# Patient Record
Sex: Female | Born: 1965 | Race: White | Hispanic: No | Marital: Married | Smoking: Never smoker
Health system: Southern US, Community
[De-identification: ages and names within clinical notes are randomized; demographics above are authoritative.]

## PROBLEM LIST (undated history)

## (undated) DIAGNOSIS — Z923 Personal history of irradiation: Secondary | ICD-10-CM

## (undated) DIAGNOSIS — K219 Gastro-esophageal reflux disease without esophagitis: Secondary | ICD-10-CM

## (undated) DIAGNOSIS — K5909 Other constipation: Secondary | ICD-10-CM

## (undated) DIAGNOSIS — Z8042 Family history of malignant neoplasm of prostate: Secondary | ICD-10-CM

## (undated) DIAGNOSIS — E7800 Pure hypercholesterolemia, unspecified: Secondary | ICD-10-CM

## (undated) DIAGNOSIS — M722 Plantar fascial fibromatosis: Secondary | ICD-10-CM

## (undated) DIAGNOSIS — Z800 Family history of malignant neoplasm of digestive organs: Secondary | ICD-10-CM

## (undated) DIAGNOSIS — I10 Essential (primary) hypertension: Secondary | ICD-10-CM

## (undated) DIAGNOSIS — C50919 Malignant neoplasm of unspecified site of unspecified female breast: Secondary | ICD-10-CM

## (undated) DIAGNOSIS — C801 Malignant (primary) neoplasm, unspecified: Secondary | ICD-10-CM

## (undated) DIAGNOSIS — E538 Deficiency of other specified B group vitamins: Secondary | ICD-10-CM

## (undated) HISTORY — PX: TONSILLECTOMY: SUR1361

## (undated) HISTORY — PX: BREAST LUMPECTOMY: SHX2

## (undated) HISTORY — PX: SKIN BIOPSY: SHX1

## (undated) HISTORY — DX: Family history of malignant neoplasm of prostate: Z80.42

## (undated) HISTORY — DX: Other constipation: K59.09

## (undated) HISTORY — DX: Family history of malignant neoplasm of digestive organs: Z80.0

## (undated) HISTORY — PX: CHOLECYSTECTOMY: SHX55

## (undated) HISTORY — DX: Malignant (primary) neoplasm, unspecified: C80.1

## (undated) HISTORY — PX: APPENDECTOMY: SHX54

## (undated) HISTORY — DX: Pure hypercholesterolemia, unspecified: E78.00

## (undated) HISTORY — DX: Malignant neoplasm of unspecified site of unspecified female breast: C50.919

## (undated) HISTORY — DX: Deficiency of other specified B group vitamins: E53.8

## (undated) HISTORY — DX: Plantar fascial fibromatosis: M72.2

## (undated) HISTORY — PX: OOPHORECTOMY: SHX86

## (undated) HISTORY — DX: Essential (primary) hypertension: I10

## (undated) HISTORY — PX: OTHER SURGICAL HISTORY: SHX169

## (undated) HISTORY — DX: Gastro-esophageal reflux disease without esophagitis: K21.9

---

## 1997-07-27 ENCOUNTER — Other Ambulatory Visit: Admission: RE | Admit: 1997-07-27 | Discharge: 1997-07-27 | Payer: Self-pay | Admitting: Obstetrics and Gynecology

## 1998-07-30 ENCOUNTER — Other Ambulatory Visit: Admission: RE | Admit: 1998-07-30 | Discharge: 1998-07-30 | Payer: Self-pay | Admitting: Obstetrics and Gynecology

## 2002-09-16 DIAGNOSIS — C8590 Non-Hodgkin lymphoma, unspecified, unspecified site: Secondary | ICD-10-CM | POA: Insufficient documentation

## 2004-03-21 ENCOUNTER — Ambulatory Visit: Payer: Self-pay | Admitting: Oncology

## 2004-05-10 ENCOUNTER — Encounter (INDEPENDENT_AMBULATORY_CARE_PROVIDER_SITE_OTHER): Payer: Self-pay | Admitting: Oncology

## 2004-05-10 ENCOUNTER — Ambulatory Visit: Payer: Self-pay | Admitting: Oncology

## 2004-05-10 ENCOUNTER — Other Ambulatory Visit: Admission: RE | Admit: 2004-05-10 | Discharge: 2004-05-10 | Payer: Self-pay | Admitting: Oncology

## 2004-07-15 ENCOUNTER — Ambulatory Visit: Payer: Self-pay | Admitting: Oncology

## 2004-09-28 ENCOUNTER — Ambulatory Visit: Payer: Self-pay | Admitting: Oncology

## 2005-02-06 ENCOUNTER — Ambulatory Visit: Payer: Self-pay | Admitting: Oncology

## 2005-05-01 ENCOUNTER — Ambulatory Visit: Payer: Self-pay | Admitting: Oncology

## 2005-07-26 ENCOUNTER — Ambulatory Visit: Payer: Self-pay | Admitting: Oncology

## 2005-10-18 ENCOUNTER — Ambulatory Visit: Payer: Self-pay | Admitting: Oncology

## 2006-01-10 ENCOUNTER — Ambulatory Visit: Payer: Self-pay | Admitting: Oncology

## 2006-04-04 ENCOUNTER — Ambulatory Visit: Payer: Self-pay | Admitting: Oncology

## 2006-06-27 ENCOUNTER — Ambulatory Visit: Payer: Self-pay | Admitting: Oncology

## 2006-09-19 ENCOUNTER — Ambulatory Visit: Payer: Self-pay | Admitting: Oncology

## 2007-11-26 DIAGNOSIS — D519 Vitamin B12 deficiency anemia, unspecified: Secondary | ICD-10-CM | POA: Insufficient documentation

## 2012-06-24 HISTORY — PX: ESOPHAGOGASTRODUODENOSCOPY: SHX1529

## 2013-02-27 HISTORY — PX: BREAST LUMPECTOMY: SHX2

## 2013-08-08 ENCOUNTER — Encounter: Payer: Self-pay | Admitting: Gastroenterology

## 2013-08-08 HISTORY — PX: COLONOSCOPY: SHX174

## 2013-10-16 DIAGNOSIS — C50919 Malignant neoplasm of unspecified site of unspecified female breast: Secondary | ICD-10-CM

## 2013-10-16 HISTORY — DX: Malignant neoplasm of unspecified site of unspecified female breast: C50.919

## 2014-03-17 ENCOUNTER — Telehealth: Payer: Self-pay | Admitting: Genetic Counselor

## 2014-03-17 NOTE — Telephone Encounter (Signed)
S/W PATIENT AND GAVE GENETIC APPT FOR 01/20 @ 3 Shari Prows POWELL

## 2014-03-18 ENCOUNTER — Other Ambulatory Visit: Payer: Self-pay

## 2014-03-18 ENCOUNTER — Ambulatory Visit (HOSPITAL_BASED_OUTPATIENT_CLINIC_OR_DEPARTMENT_OTHER): Payer: BC Managed Care – PPO | Admitting: Genetic Counselor

## 2014-03-18 ENCOUNTER — Encounter: Payer: Self-pay | Admitting: Genetic Counselor

## 2014-03-18 DIAGNOSIS — C801 Malignant (primary) neoplasm, unspecified: Secondary | ICD-10-CM | POA: Insufficient documentation

## 2014-03-18 DIAGNOSIS — Z800 Family history of malignant neoplasm of digestive organs: Secondary | ICD-10-CM

## 2014-03-18 DIAGNOSIS — Z859 Personal history of malignant neoplasm, unspecified: Secondary | ICD-10-CM

## 2014-03-18 DIAGNOSIS — Z8572 Personal history of non-Hodgkin lymphomas: Secondary | ICD-10-CM

## 2014-03-18 DIAGNOSIS — Z853 Personal history of malignant neoplasm of breast: Secondary | ICD-10-CM

## 2014-03-18 DIAGNOSIS — Z8042 Family history of malignant neoplasm of prostate: Secondary | ICD-10-CM | POA: Insufficient documentation

## 2014-03-18 NOTE — Progress Notes (Signed)
REFERRING PROVIDER: Hosie Poisson, MD  PRIMARY PROVIDER:  No primary care provider on file.  PRIMARY REASON FOR VISIT:  1. Personal history of lymphoma   2. Personal history of breast cancer   3. Family history of colon cancer      HISTORY OF PRESENT ILLNESS:   Ms. Homewood, a 49 y.o. female, was seen for a [place] cancer genetics consultation at the request of Dr. Hinton Rao due to a personal and family history of cancer.  Ms. Centner presents to clinic today to discuss the possibility of a hereditary predisposition to cancer, genetic testing, and to further clarify her future cancer risks, as well as potential cancer risks for family members.   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  This was excised but no other treatment occurred.  In 2006, at the age of 39, Ms. Marston was diagnosed with lymphoma again and received chemotherapy.  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  This was treated with lumpectomy and radiation.  She is currently on tamoxifen.      CANCER HISTORY:   No history exists.     HORMONAL RISK FACTORS:  Menarche was at age 68.  First live birth at age 10.  OCP use for approximately 27 years.  Ovaries intact: yes.  Hysterectomy: no.  Menopausal status: premenopausal.  HRT use: 0 years. Colonoscopy: yes; 1 polyp. Mammogram within the last year: yes. Number of breast biopsies: 1. Up to date with pelvic exams:  yes. Any excessive radiation exposure in the past:  yes  Past Medical History  Diagnosis Date  . Breast cancer 10/16/2013  . Cancer 2004/2006    Low B Cell lymphoma  . Family history of colon cancer   . Family history of prostate cancer     History reviewed. No pertinent past surgical history.  History   Social History  . Marital Status: Married    Spouse Name: N/A    Number of Children: 1  . Years of Education: N/A   Social History  Main Topics  . Smoking status: Never Smoker   . Smokeless tobacco: None  . Alcohol Use: No  . Drug Use: None  . Sexual Activity: None   Other Topics Concern  . None   Social History Narrative  . None     FAMILY HISTORY:  We obtained a detailed, 4-generation family history.  Significant diagnoses are listed below: Family History  Problem Relation Age of Onset  . Lung cancer Father 34  . Cancer Maternal Aunt 88    GIST  . Colon cancer Maternal Uncle 61    dx twice at age 13  . Lung cancer Maternal Grandmother 28  . Prostate cancer Maternal Grandfather 58   Ms. Couser has a 34 YO brother.  Her parents are both deceased.  She has two maternal aunts and a maternal uncle.  Her maternal uncle was diagnosed with colon cancer, twice, at age 63.  One aunt had a GIST at age 66 and died at 29.  Her maternal grandfather had prostate cancer and died at 31.  His maternal aunts, uncles and cousins reportedly had colon cancer.  Patient's ancestors are of Caucasian descent. There is no reported Ashkenazi Jewish ancestry. There is no known consanguinity.  GENETIC COUNSELING ASSESSMENT: LIALA CODISPOTI is a 49 y.o. female with a personal history of breast cancer  and lymphoma and a family history of colon and prostate cancer and GIST. We, therefore, discussed and recommended the following at today's visit.   DISCUSSION: We reviewed the characteristics, features and inheritance patterns of hereditary cancer syndromes. We discussed that both she and her uncle were diagnosed with cancer more than one time, which is a sign of a hereditary cancer syndorme.  However, I am not able to link one type of cancer syndrome to this family.  My biggest concern is the GIST, and there is a remote family history of colon cancer. We also discussed genetic testing, including the appropriate family members to test, the process of testing, insurance coverage and turn-around-time for results. We discussed that we can do a  preverification for a CancerNext-expanded panel in order to get the colon, breast and GIST cancer genes.  We also discussed trying to get her uncle, Wilburn Mylar, in for testing.  Since he is closer to the relatives with colon cancer, he may qualify for Lynch syndrome testing.  PLAN: After considering the risks, benefits, and limitations,Ms. Kenagy  provided informed consent to pursue preverification for genetic testing through [facility] for the CancerNext-Expanded panel test.  The CancerNext-Expanded gene panel offered by [employer] and includes sequencing and rearrangement analysis for the following 49 genes: APC, ATM, BAP1, BARD1, BMPR1A, BRCA1, BRCA2, BRIP1, CDH1, CDK4, CDKN2A, CHEK2, FH, FLCN, MAX, MEN1, MET, MLH1, MRE11A, MSH2, MSH6, MUTYH, NBN, NF1, PALB2, PMS2, POLD1, POLE, PTEN, RAD50, RAD51D, RET, SDHA, SDHAF2, SDHB, SDHC, SDHD, SMAD4, SMARCA4, STK11, TMEM127, TP53, TSC1, TSC2, and VHL. If she has a low out of pocket cost, Ms. Langsam will come back and get her blood drawn.  Results should be available within approximately 3-4 weeks' time, at which point they will be disclosed by telephone to Ms. Gilkes, as will any additional recommendations warranted by these results. Ms. Salas will receive a summary of her genetic counseling visit and a copy of her results once available. This information will also be available in Epic. We encouraged Ms. Vizzini to remain in contact with cancer genetics annually so that we can continuously update the family history and inform her of any changes in cancer genetics and testing that may be of benefit for her family. Ms. Portillo questions were answered to her satisfaction today. Our contact information was provided should additional questions or concerns arise.  Based on Ms. Schoneman's family history, we recommended her uncle, Wilburn Mylar, who was diagnosed with colon cancer at age 2, have genetic counseling and testing. Ms. Lein will let us know if we can be  of any assistance in coordinating genetic counseling and/or testing for this family member.   Lastly, we encouraged Ms. Kubitz to remain in contact with cancer genetics annually so that we can continuously update the family history and inform her of any changes in cancer genetics and testing that may be of benefit for this family.   Ms.  Tatsch questions were answered to her satisfaction today. Our contact information was provided should additional questions or concerns arise. Thank you for the referral and allowing [place] to share in the care of your patient.   Karen P. Florene Glen, [place], [facility] Certified Genetic Counselor [email or web address] phone: [phone]  The patient was seen for a total of 60 minutes in face-to-face genetic counseling.  This patient was discussed with Drs. Magrinat, Lindi Adie and/or Burr Medico who agrees with the above.    _______________________________________________________________________ For Office Staff:  Number of people involved in session: 2 Was an Intern/  student involved with case: no

## 2014-04-13 ENCOUNTER — Other Ambulatory Visit: Payer: BC Managed Care – PPO

## 2014-04-13 ENCOUNTER — Encounter: Payer: BC Managed Care – PPO | Admitting: Genetic Counselor

## 2014-04-13 ENCOUNTER — Telehealth: Payer: Self-pay | Admitting: Genetic Counselor

## 2014-04-13 NOTE — Telephone Encounter (Signed)
Pt aware of appt.

## 2014-04-27 ENCOUNTER — Other Ambulatory Visit: Payer: BC Managed Care – PPO

## 2014-04-27 ENCOUNTER — Ambulatory Visit (HOSPITAL_BASED_OUTPATIENT_CLINIC_OR_DEPARTMENT_OTHER): Payer: BC Managed Care – PPO | Admitting: Genetic Counselor

## 2014-04-27 ENCOUNTER — Encounter: Payer: Self-pay | Admitting: Genetic Counselor

## 2014-04-27 DIAGNOSIS — Z8572 Personal history of non-Hodgkin lymphomas: Secondary | ICD-10-CM

## 2014-04-27 DIAGNOSIS — Z853 Personal history of malignant neoplasm of breast: Secondary | ICD-10-CM

## 2014-04-27 DIAGNOSIS — Z800 Family history of malignant neoplasm of digestive organs: Secondary | ICD-10-CM

## 2014-04-27 DIAGNOSIS — Z859 Personal history of malignant neoplasm, unspecified: Secondary | ICD-10-CM

## 2014-04-27 NOTE — Progress Notes (Signed)
Kerri Jones came in to complete paperwork and have a blood draw performed.  Blood was drawn for the breast/GYN, Colon cancer and GIST panels through [facility].  [facility] will preauthorize [name]'s test and if she is found to have an OOP cost over $100 will call for approval.  Results will be back in 3-4 weeks.

## 2014-05-07 ENCOUNTER — Encounter: Payer: Self-pay | Admitting: Genetic Counselor

## 2014-05-07 ENCOUNTER — Telehealth: Payer: Self-pay | Admitting: Genetic Counselor

## 2014-05-07 DIAGNOSIS — Z1379 Encounter for other screening for genetic and chromosomal anomalies: Secondary | ICD-10-CM | POA: Insufficient documentation

## 2014-05-07 NOTE — Telephone Encounter (Signed)
Revealed negative genetic testing on a custom panel created through [facility] of 37 genes testing for hereditary breast, colon and GIST tumors.

## 2014-05-07 NOTE — Progress Notes (Signed)
HPI: Kerri Jones was previously seen in the [place] clinic due to a personal history of lymphoma and breast cancer and family history of breast, colon and GIST cancer and concerns regarding a hereditary predisposition to cancer. Please refer to our prior cancer genetics clinic note for more information regarding Kerri Jones's medical, social and family histories, and our assessment and recommendations, at the time. Kerri Jones recent genetic test results were disclosed to her, as were recommendations warranted by these results. These results and recommendations are discussed in more detail below.  GENETIC TEST RESULTS: At the time of Kerri Jones's visit, we recommended she pursue genetic testing of the custom gene panel. The custom panel for breast, colon and paraganglioma cancer syndrome gene panel offered by [facility] and includes sequencing and rearrangement analysis for the following 37 genes: APC, ATM, AXIN2, BARD1, BMPR1A, BRCA1, BRCA2, BRIP1, CDH1, CHEK2, DICER1, EPCAM (Deletion/duplication testing only), GREM1 (Promoter region deletion/duplication testing only), KIT, MLH1, MSH2, MSH6, MUTYH, NBN, NF1, PALB2, PDGFRA, PMS2, POLD1, POLE, PTEN, RAD50, RAD51C, RAD51D, SDHA (sequencing only), SDHB, SDHC, SDHD, SMAD4, SMARCA4, STK11 and TP53. The report date is 05/07/2014. The report date is May 07, 2014.  Testing was performed at [facility]. Genetic testing was normal, and did not reveal a deleterious mutation in these genes. The test report has been scanned into EPIC and is located under the Media tab.   We discussed with Kerri Jones that since the current genetic testing is not perfect, it is possible there may be a gene mutation in one of these genes that current testing cannot detect, but that chance is small. We also discussed, that it is possible that another gene that has not yet been discovered, or that we have not yet tested, is responsible for the cancer  diagnoses in the family, and it is, therefore, important to remain in touch with cancer genetics in the future so that we can continue to offer Kerri Jones the most up to date genetic testing.   CANCER SCREENING RECOMMENDATIONS: This result is reassuring and suggests that Kerri Jones's personal and family history of cancer was most likely not due to an inherited predisposition associated with one of these genes. Most cancers happen by chance and this negative test, along with details of her family history, suggests that her cancer falls into this category. We, therefore, recommended she continue to follow the cancer management and screening guidelines provided by her oncology and primary providers.   RECOMMENDATIONS FOR FAMILY MEMBERS: Women in this family might be at some increased risk of developing cancer, over the general population risk, simply due to the family history of cancer. We recommended women in this family have a yearly mammogram beginning at age 68, or 47 years younger than the earliest onset of cancer, an an annual clinical breast exam, and perform monthly breast self-exams. Women in this family should also have a gynecological exam as recommended by their primary provider. All family members should have a colonoscopy by age 59.  FOLLOW-UP: Lastly, we discussed with Kerri Jones that cancer genetics is a rapidly advancing field and it is possible that new genetic tests will be appropriate for her and/or her family members in the future. We encouraged her to remain in contact with cancer genetics on an annual basis so we can update her personal and family histories and let her know of advances in cancer genetics that may benefit this family.   Our contact number was provided. Kerri Jones questions  were answered to her satisfaction, and she knows she is welcome to call us at anytime with additional questions or concerns.   Roma Kayser, MS, [facility] Certified Genetic  Counselor [email or web address]

## 2014-06-01 ENCOUNTER — Encounter: Payer: Self-pay | Admitting: Genetic Counselor

## 2014-08-11 ENCOUNTER — Other Ambulatory Visit: Payer: Self-pay

## 2014-08-11 DIAGNOSIS — C8599 Non-Hodgkin lymphoma, unspecified, extranodal and solid organ sites: Secondary | ICD-10-CM | POA: Insufficient documentation

## 2014-08-12 ENCOUNTER — Other Ambulatory Visit (HOSPITAL_COMMUNITY)
Admission: RE | Admit: 2014-08-12 | Discharge: 2014-08-12 | Disposition: A | Discharge status: Home / Self Care | Payer: BC Managed Care – PPO | Source: Ambulatory Visit | Attending: Obstetrics and Gynecology | Admitting: Obstetrics and Gynecology

## 2014-08-12 DIAGNOSIS — C50919 Malignant neoplasm of unspecified site of unspecified female breast: Secondary | ICD-10-CM | POA: Diagnosis not present

## 2014-08-25 ENCOUNTER — Encounter (HOSPITAL_COMMUNITY): Payer: Self-pay

## 2014-08-26 ENCOUNTER — Telehealth: Payer: Self-pay | Admitting: *Deleted

## 2014-08-26 NOTE — Telephone Encounter (Signed)
Received call from Bebe Liter at Dr. Gordy Councilman office requesting to cancel appt for patient on 09/04/14 with Dr. Denman George. She states Dr. Hinton Rao, the patient's oncologist, does not feel that she needs to see Dr. Denman George at this time. Appt cancelled and Butch Penny instructed to please call our office back in the future if patient does need to be seen.

## 2014-09-04 ENCOUNTER — Ambulatory Visit: Payer: BC Managed Care – PPO | Admitting: Gynecologic Oncology

## 2015-11-04 DIAGNOSIS — N63 Unspecified lump in breast: Secondary | ICD-10-CM | POA: Diagnosis not present

## 2015-11-04 DIAGNOSIS — Z853 Personal history of malignant neoplasm of breast: Secondary | ICD-10-CM | POA: Diagnosis not present

## 2015-11-04 DIAGNOSIS — Z8572 Personal history of non-Hodgkin lymphomas: Secondary | ICD-10-CM | POA: Diagnosis not present

## 2016-03-08 DIAGNOSIS — Z853 Personal history of malignant neoplasm of breast: Secondary | ICD-10-CM

## 2016-03-08 DIAGNOSIS — Z8572 Personal history of non-Hodgkin lymphomas: Secondary | ICD-10-CM | POA: Diagnosis not present

## 2016-04-13 ENCOUNTER — Ambulatory Visit (INDEPENDENT_AMBULATORY_CARE_PROVIDER_SITE_OTHER): Payer: BC Managed Care – PPO | Admitting: Pulmonary Disease

## 2016-04-13 ENCOUNTER — Encounter: Payer: Self-pay | Admitting: Pulmonary Disease

## 2016-04-13 DIAGNOSIS — G4710 Hypersomnia, unspecified: Secondary | ICD-10-CM

## 2016-04-13 NOTE — Assessment & Plan Note (Signed)
Given excessive daytime somnolence, narrow pharyngeal exam, witnessed apneas & loud snoring, obstructive sleep apnea is very likely & an overnight polysomnogram will be scheduled as a split study. The pathophysiology of obstructive sleep apnea , it's cardiovascular consequences & modes of treatment including CPAP were discused with the patient in detail & they evidenced understanding.  

## 2016-04-13 NOTE — Patient Instructions (Signed)
We discussed the possibility of obstructive sleep apnea Schedule sleep study

## 2016-04-13 NOTE — Progress Notes (Signed)
Subjective:    Patient ID: Kerri Jones, female    DOB: [birth year], 51 y.o.   MRN: [id number]  HPI  Chief Complaint  Patient presents with  . Sleep Consult    Snoring. Pt. states she wakes from snoring at night. Does not feel rested in the morning.     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 Loud snoring has been noted by her husband and this occasionally wakes her up gasping from her sleep. She's noted over the past 2 years that she easily falls asleep and radius social situations. Epworth sleepiness score is 20 and she reports sleepiness while watching TV, sitting inactive in a public place or as a passenger in a car. She was evaluated by ENT in [place]. Bedtime is between 10 and 10:30 PM, sleep latency is 15-30 minutes, she sleeps on her right side with one pillow. She has used a mouth guard for at least 2 years due to bruxism. She reports frequent nocturnal awakenings and non-refreshing sleep with occasional bathroom visits, is out of bed by 5:45 AM feeling tired with occasional dryness of mouth but denies headaches. 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 In 2015 she was diagnosed with breast cancer, could not tolerate tamoxifen and underwent oophorectomy and radiation    Past Medical History:  Diagnosis Date  . Breast cancer ([place]) 10/16/2013  . Cancer ([place]) 2004/2006   Low B Cell lymphoma  . Family history of colon cancer   . Family history of prostate cancer     No past surgical history on file.   Allergies  Allergen Reactions  . Rituxan [Rituximab]   . Vicodin [Hydrocodone-Acetaminophen]      Social History   Social History  . Marital status:  Married    Spouse name: N/A  . Number of children: 1  . Years of education: N/A   Occupational History  . Not on file.   Social History Main Topics  . Smoking status: Never Smoker  . Smokeless tobacco: Never Used  . Alcohol use No  . Drug use: Unknown  . Sexual activity: Not on file   Other Topics Concern  . Not on file   Social History Narrative  . No narrative on file    Family History  Problem Relation Age of Onset  . Lung cancer Father 35  . Cancer Maternal Aunt 60    GIST  . Colon cancer Maternal Uncle 67    dx twice at age 56  . Lung cancer Maternal Grandmother 41  . Prostate cancer Maternal Grandfather 64    Review of Systems Constitutional: negative for anorexia, fevers and sweats  Eyes: negative for irritation, redness and visual disturbance  Ears, nose, mouth, throat, and face: negative for earaches, epistaxis, nasal congestion and sore throat  Respiratory: negative for cough, dyspnea on exertion, sputum and wheezing  Cardiovascular: negative for chest pain, dyspnea, lower extremity edema, orthopnea, palpitations and syncope  Gastrointestinal: negative for abdominal pain, constipation, diarrhea, melena, nausea and vomiting  Genitourinary:negative for dysuria, frequency and hematuria  Hematologic/lymphatic: negative for bleeding, easy bruising and lymphadenopathy  Musculoskeletal:negative for arthralgias, muscle weakness and stiff joints  Neurological: negative for coordination problems, gait problems, headaches  and weakness  Endocrine: negative for diabetic symptoms including polydipsia, polyuria and weight loss     Objective:   Physical Exam  Gen. Pleasant,  in no distress ENT - no JVD, no post nasal drip Neck: No JVD, no thyromegaly, no carotid bruits Lungs: no use of accessory muscles, no dullness to percussion, decreased without rales or rhonchi  Cardiovascular: Rhythm regular, heart sounds  normal, no murmurs or gallops, no peripheral  edema Musculoskeletal: No deformities, no cyanosis or clubbing , no tremors       Assessment & Plan:

## 2016-05-08 ENCOUNTER — Telehealth: Payer: Self-pay | Admitting: Pulmonary Disease

## 2016-05-08 NOTE — Telephone Encounter (Signed)
Yes pt states it is ok to send the records

## 2016-05-08 NOTE — Telephone Encounter (Signed)
[facility] ENT is requesting last OV note to be faxed to them. Attempted to contact pt to make sure that she is actually a pt at this office. When she answered the phone, she asked that we call her back later.

## 2016-05-08 NOTE — Telephone Encounter (Signed)
OV note has been faxed. Nothing further was needed. 

## 2016-05-16 DIAGNOSIS — L989 Disorder of the skin and subcutaneous tissue, unspecified: Secondary | ICD-10-CM | POA: Diagnosis not present

## 2016-05-16 DIAGNOSIS — Z8572 Personal history of non-Hodgkin lymphomas: Secondary | ICD-10-CM | POA: Diagnosis not present

## 2016-05-31 ENCOUNTER — Ambulatory Visit (HOSPITAL_BASED_OUTPATIENT_CLINIC_OR_DEPARTMENT_OTHER): Payer: BC Managed Care – PPO | Attending: Pulmonary Disease | Admitting: Pulmonary Disease

## 2016-05-31 DIAGNOSIS — G4710 Hypersomnia, unspecified: Secondary | ICD-10-CM | POA: Diagnosis present

## 2016-06-01 ENCOUNTER — Encounter (HOSPITAL_BASED_OUTPATIENT_CLINIC_OR_DEPARTMENT_OTHER): Payer: BC Managed Care – PPO

## 2016-06-06 ENCOUNTER — Telehealth: Payer: Self-pay | Admitting: Pulmonary Disease

## 2016-06-06 DIAGNOSIS — G4710 Hypersomnia, unspecified: Secondary | ICD-10-CM

## 2016-06-06 NOTE — Procedures (Signed)
Patient Name: Kerri Jones, Kerri Jones Date: 05/31/2016 Gender: Female D.O.B: [birth year] Age (years): 71 Referring Provider: Kara Mead MD, ABSM Height (inches): 65 Interpreting Physician: Kara Mead MD, ABSM Weight (lbs): 180 RPSGT: Gerhard Perches BMI: 30 MRN: [id number] Neck Size: 13.00   CLINICAL INFORMATION Sleep Study Type: NPSG  Indication for sleep study: Snoring  Epworth Sleepiness Score: 16     SLEEP STUDY TECHNIQUE As per the AASM Manual for the Scoring of Sleep and Associated Events v2.3 (April 2016) with a hypopnea requiring 4% desaturations.  The channels recorded and monitored were frontal, central and occipital EEG, electrooculogram (EOG), submentalis EMG (chin), nasal and oral airflow, thoracic and abdominal wall motion, anterior tibialis EMG, snore microphone, electrocardiogram, and pulse oximetry.  SLEEP ARCHITECTURE The study was initiated at 10:15:27 PM and ended at 4:57:51 AM.  Sleep onset time was 39.3 minutes and the sleep efficiency was 47.6%. The total sleep time was 191.6 minutes.  Stage REM latency was 210.5 minutes.  The patient spent 37.85% of the night in stage N1 sleep, 35.79% in stage N2 sleep, 24.80% in stage N3 and 1.57% in REM.  Alpha intrusion was absent.  Supine sleep was 9.66%.  RESPIRATORY PARAMETERS The overall apnea/hypopnea index (AHI) was 0.0 per hour. There were 0 total apneas, including 0 obstructive, 0 central and 0 mixed apneas. There were 0 hypopneas and 6 RERAs.  The AHI during Stage REM sleep was 0.0 per hour.  AHI while supine was 0.0 per hour.  The mean oxygen saturation was 94.77%. The minimum SpO2 during sleep was 93.00%.  Moderate snoring was noted during this study.  CARDIAC DATA The 2 lead EKG demonstrated sinus rhythm. The mean heart rate was 58.06 beats per minute. Other EKG findings include: None.   LEG MOVEMENT DATA The total PLMS were 0 with a resulting PLMS index of 0.00. Associated arousal with  leg movement index was 0.0 .  IMPRESSIONS - No significant obstructive sleep apnea occurred during this study (AHI = 0.0/h). - No significant central sleep apnea occurred during this study (CAI = 0.0/h). - The patient had minimal or no oxygen desaturation during the study (Min O2 = 93.00%) - The patient snored with Moderate snoring volume. - No cardiac abnormalities were noted during this study. - Clinically significant periodic limb movements did not occur during sleep. No significant associated arousals.   DIAGNOSIS - No evidence of sleep disordered breathing    RECOMMENDATIONS - If sleepiness persists, review medications & consider MSLT Avoid alcohol, sedatives and other CNS depressants that may worsen sleep apnea and disrupt normal sleep architecture. - Sleep hygiene should be reviewed to assess factors that may improve sleep quality. - Weight management and regular exercise should be initiated or continued if appropriate.   Kara Mead MD Board Certified in [place]

## 2016-06-06 NOTE — Telephone Encounter (Signed)
No evidence of OSA on PSG If sleepiness persists, consider nap study to investigate for narcolepsy

## 2016-06-08 NOTE — Telephone Encounter (Signed)
Left message to call back for results

## 2016-06-09 NOTE — Telephone Encounter (Signed)
Pt returning call for sleep results, please advise [phone].Kerri Jones

## 2016-06-09 NOTE — Telephone Encounter (Signed)
Spoke with pt, aware of results/recs.  Wishes to proceed with nap study.  Nap study has been ordered.  Nothing further needed.

## 2016-07-03 ENCOUNTER — Telehealth: Payer: Self-pay | Admitting: Pulmonary Disease

## 2016-07-03 NOTE — Telephone Encounter (Signed)
OV note from 04/13/16 have been faxed to provided number. Received successful fax confirmation. Nothing further needed.

## 2016-07-18 ENCOUNTER — Encounter (HOSPITAL_BASED_OUTPATIENT_CLINIC_OR_DEPARTMENT_OTHER): Payer: BC Managed Care – PPO

## 2016-07-19 DIAGNOSIS — Z853 Personal history of malignant neoplasm of breast: Secondary | ICD-10-CM | POA: Diagnosis not present

## 2016-07-19 DIAGNOSIS — Z8572 Personal history of non-Hodgkin lymphomas: Secondary | ICD-10-CM | POA: Diagnosis not present

## 2016-08-23 ENCOUNTER — Ambulatory Visit (HOSPITAL_BASED_OUTPATIENT_CLINIC_OR_DEPARTMENT_OTHER): Payer: BC Managed Care – PPO | Attending: Pulmonary Disease | Admitting: Pulmonary Disease

## 2016-08-23 DIAGNOSIS — G4711 Idiopathic hypersomnia with long sleep time: Secondary | ICD-10-CM | POA: Diagnosis not present

## 2016-08-23 DIAGNOSIS — G4710 Hypersomnia, unspecified: Secondary | ICD-10-CM | POA: Diagnosis present

## 2016-08-28 DIAGNOSIS — G4711 Idiopathic hypersomnia with long sleep time: Secondary | ICD-10-CM | POA: Diagnosis not present

## 2016-08-28 NOTE — Procedures (Signed)
Patient Name: Kerri Jones, Kerri Jones Date: 08/23/2016 Gender: Female D.O.B: 1965-05-05 Age (years): 76 Referring Provider: Kara Mead MD, ABSM Height (inches): 65 Interpreting Physician: Kara Mead MD, ABSM Weight (lbs): 185 RPSGT: Jacolyn Reedy BMI: 31 MRN: [id number] Neck Size: 13.00   CLINICAL INFORMATION Sleep Study Type: MSLT  The patient was referred to the sleep center for evaluation of daytime sleepiness.  Epworth Sleepiness Score: 16   Most recent polysomnogram dated 05/31/2016 revealed an AHI of 0.0/h and RDI of 1.9/h.   SLEEP STUDY TECHNIQUE A Multiple Sleep Latency Test was performed after an overnight polysomnogram according to the AASM scoring manual v2.3 (April 2016) and clinical guidelines. Five nap opportunities occurred over the course of the test which followed an overnight polysomnogram. The channels recorded and monitored were frontal, central, and occipital electroencephalography (EEG), right and left electrooculogram (EOG), chin electromyography (EMG), and electrocardiogram (EKG).  MEDICATIONS Medications taken by the patient : N/A  Medications administered by patient during sleep study : No sleep medicine administered.   IMPRESSIONS - Total number of naps attempted: 5.00 . Total number of naps with sleep attained: 5 . The Mean Sleep Latency was 02:42 minutes. There were no sleep-onset REM periods. - The patient appears to have pathologic sleepiness, evidenced by a short mean sleep latency (8 minutes or less) on this MSLT. - No sleep onset REMs were noted during this MSLT.   DIAGNOSIS - Pathologic Sleepiness (- [G47.10 ICD-10]) - Idiopathic hypersomnia (327.11 [G47.11 ICD-10])   RECOMMENDATIONS - Return for follow up and management of Idiopathic Hypersomnia. - Return for follow up to evaluate other causes of excessive daytime sleepiness.   Kara Mead MD Board Certified in [place]

## 2016-09-11 ENCOUNTER — Telehealth: Payer: Self-pay | Admitting: Pulmonary Disease

## 2016-09-11 NOTE — Telephone Encounter (Signed)
Did not show narcolepsy. 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

## 2016-09-11 NOTE — Telephone Encounter (Signed)
Spoke with pt. She would like her results from the multiple latency test.  RA - please advise.

## 2016-09-12 NOTE — Telephone Encounter (Signed)
Spoke with pt. She is aware of her sleep latency test results. Pt has been scheduled to see TP on 10/12/16 at 4:15pm. Nothing further was needed.

## 2016-10-12 ENCOUNTER — Encounter: Payer: Self-pay | Admitting: Adult Health

## 2016-10-12 ENCOUNTER — Ambulatory Visit (INDEPENDENT_AMBULATORY_CARE_PROVIDER_SITE_OTHER): Payer: BC Managed Care – PPO | Admitting: Adult Health

## 2016-10-12 DIAGNOSIS — G4710 Hypersomnia, unspecified: Secondary | ICD-10-CM | POA: Diagnosis not present

## 2016-10-12 NOTE — Patient Instructions (Signed)
Healthy sleep regimen as discussed  Follow up As needed

## 2016-10-12 NOTE — Assessment & Plan Note (Signed)
Sleep study neg . MSLT neg c/w idiopathic hypersomina   Healthy sleep reigmen discussed and recommended.

## 2016-10-12 NOTE — Progress Notes (Signed)
@Patient  ID: Kerri Jones, female    DOB: November 04, 1965, 51 y.o.   MRN: [id number]  Chief Complaint  Patient presents with  . Follow-up    OSA     Referring provider: Marco Collie, MD  HPI: 51 yo female seen for sleep consult 03/2016 for daytime sleepiness   TEST  NSPG 05/2016 AHI 0  MSLT 07/2016 >neg , c/w idiopathic hypersomnia   10/12/2016 Follow up : Daytime sleepiness Patient returns for a six-month follow-up. She was seen in February for sleep consult. She was set up for a sleep study that was done in April that showed no significant sleep apnea with AHI of 0. She was set up for a sleep latency test for possible narcolepsy. This was negative. 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 We discussed a healthy sleep regimen , decreased caffeine intake , exercise and decreased computer/phone exposure.  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 Not as sleepy during daytime .    Allergies  Allergen Reactions  . Rituxan [Rituximab]   . Vicodin [Hydrocodone-Acetaminophen]     Immunization History  Administered Date(s) Administered  . Influenza [place] 11/28/2015    Past Medical History:  Diagnosis Date  . Breast cancer ([place]) 10/16/2013  . 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 Family history of colon cancer   . Family history of prostate cancer     Tobacco History: History  Smoking Status  . Never Smoker  Smokeless Tobacco  . Never Used   Counseling given: Not Answered   Outpatient Encounter Prescriptions as of 10/12/2016  Medication Sig  . amLODipine (NORVASC) 2.5 MG tablet Take 2.5 mg by mouth daily.  . cholecalciferol (VITAMIN D) 1000 units tablet Take 6,000 Units by mouth daily.  . citalopram (CELEXA) 10 MG tablet Take 10 mg by mouth daily.  Marland Kitchen dexlansoprazole (DEXILANT) 60 MG capsule Take 60 mg by mouth daily.  Marland Kitchen ezetimibe (ZETIA) 10 MG tablet Take 10 mg by mouth daily.  Marland Kitchen L-Lysine 1000 MG  TABS Take 1,000 mg by mouth daily.   No facility-administered encounter medications on file as of 10/12/2016.      Review of Systems  Constitutional:   No  weight loss, night sweats,  Fevers, chills, fatigue, or  lassitude.  HEENT:   No headaches,  Difficulty swallowing,  Tooth/dental problems, or  Sore throat,                No sneezing, itching, ear ache, nasal congestion, post nasal drip,   CV:  No chest pain,  Orthopnea, PND, swelling in lower extremities, anasarca, dizziness, palpitations, syncope.   GI  No heartburn, indigestion, abdominal pain, nausea, vomiting, diarrhea, change in bowel habits, loss of appetite, bloody stools.   Resp: No shortness of breath with exertion or at rest.  No excess mucus, no productive cough,  No non-productive cough,  No coughing up of blood.  No change in color of mucus.  No wheezing.  No chest wall deformity  Skin: no rash or lesions.  GU: no dysuria, change in color of urine, no urgency or frequency.  No flank pain, no hematuria   MS:  No joint pain or swelling.  No decreased range of motion.  No back pain.    Physical Exam  BP 112/80 (BP Location: Right Arm, Cuff Size: Normal)   Pulse 69   Ht 5' 7.5" (1.715 m)  Wt 187 lb 8 oz (85 kg)   SpO2 97%   BMI 28.93 kg/m   GEN: A/Ox3; pleasant , NAD, well nourished    HEENT:  [place]/AT,  EACs-clear, TMs-wnl, NOSE-clear, THROAT-clear, no lesions, no postnasal drip or exudate noted.   NECK:  Supple w/ fair ROM; no JVD; normal carotid impulses w/o bruits; no thyromegaly or nodules palpated; no lymphadenopathy.    RESP  Clear  P & A; w/o, wheezes/ rales/ or rhonchi. no accessory muscle use, no dullness to percussion  CARD:  RRR, no m/r/g, no peripheral edema, pulses intact, no cyanosis or clubbing.  GI:   Soft & nt; nml bowel sounds; no organomegaly or masses detected.   Musco: Warm bil, no deformities or joint swelling noted.   Neuro: alert, no focal deficits noted.    Skin: Warm, no  lesions or rashes    Lab Results:  CBC No results found for: WBC, RBC, HGB, HCT, PLT, MCV, MCH, MCHC, RDW, LYMPHSABS, MONOABS, EOSABS, BASOSABS  BMET No results found for: NA, K, CL, CO2, GLUCOSE, BUN, CREATININE, CALCIUM, GFRNONAA, GFRAA  BNP No results found for: BNP  ProBNP No results found for: PROBNP  Imaging: No results found.   Assessment & Plan:   Hypersomnolence Sleep study neg . MSLT neg c/w idiopathic hypersomina   Healthy sleep reigmen discussed and recommended.      Rexene Edison, NP 10/12/2016

## 2017-06-20 DIAGNOSIS — M549 Dorsalgia, unspecified: Secondary | ICD-10-CM | POA: Diagnosis not present

## 2017-06-20 DIAGNOSIS — Z8572 Personal history of non-Hodgkin lymphomas: Secondary | ICD-10-CM | POA: Diagnosis not present

## 2017-06-20 DIAGNOSIS — Z923 Personal history of irradiation: Secondary | ICD-10-CM | POA: Diagnosis not present

## 2017-06-20 DIAGNOSIS — Z853 Personal history of malignant neoplasm of breast: Secondary | ICD-10-CM | POA: Diagnosis not present

## 2017-06-20 DIAGNOSIS — R945 Abnormal results of liver function studies: Secondary | ICD-10-CM | POA: Diagnosis not present

## 2017-06-20 DIAGNOSIS — N6489 Other specified disorders of breast: Secondary | ICD-10-CM | POA: Diagnosis not present

## 2017-06-20 DIAGNOSIS — Z87440 Personal history of urinary (tract) infections: Secondary | ICD-10-CM | POA: Diagnosis not present

## 2017-06-20 DIAGNOSIS — Z9221 Personal history of antineoplastic chemotherapy: Secondary | ICD-10-CM | POA: Diagnosis not present

## 2017-06-20 DIAGNOSIS — Z800 Family history of malignant neoplasm of digestive organs: Secondary | ICD-10-CM | POA: Diagnosis not present

## 2017-08-03 DIAGNOSIS — N6489 Other specified disorders of breast: Secondary | ICD-10-CM | POA: Diagnosis not present

## 2017-08-03 DIAGNOSIS — Z9221 Personal history of antineoplastic chemotherapy: Secondary | ICD-10-CM | POA: Diagnosis not present

## 2017-08-03 DIAGNOSIS — Z853 Personal history of malignant neoplasm of breast: Secondary | ICD-10-CM | POA: Diagnosis not present

## 2017-08-03 DIAGNOSIS — Z8572 Personal history of non-Hodgkin lymphomas: Secondary | ICD-10-CM | POA: Diagnosis not present

## 2017-08-03 DIAGNOSIS — M549 Dorsalgia, unspecified: Secondary | ICD-10-CM | POA: Diagnosis not present

## 2017-08-03 DIAGNOSIS — Z923 Personal history of irradiation: Secondary | ICD-10-CM | POA: Diagnosis not present

## 2017-10-25 DIAGNOSIS — Z8572 Personal history of non-Hodgkin lymphomas: Secondary | ICD-10-CM | POA: Diagnosis not present

## 2017-10-25 DIAGNOSIS — Z923 Personal history of irradiation: Secondary | ICD-10-CM

## 2017-10-25 DIAGNOSIS — Z9221 Personal history of antineoplastic chemotherapy: Secondary | ICD-10-CM

## 2017-10-25 DIAGNOSIS — Z853 Personal history of malignant neoplasm of breast: Secondary | ICD-10-CM

## 2017-10-25 DIAGNOSIS — N6489 Other specified disorders of breast: Secondary | ICD-10-CM

## 2018-04-12 ENCOUNTER — Encounter: Payer: Self-pay | Admitting: Gastroenterology

## 2018-04-18 ENCOUNTER — Ambulatory Visit: Payer: BC Managed Care – PPO | Admitting: Gastroenterology

## 2018-04-26 ENCOUNTER — Encounter: Payer: Self-pay | Admitting: Gastroenterology

## 2018-05-03 ENCOUNTER — Encounter: Payer: Self-pay | Admitting: Gastroenterology

## 2018-05-03 ENCOUNTER — Ambulatory Visit: Payer: BC Managed Care – PPO | Admitting: Gastroenterology

## 2018-05-03 VITALS — BP 118/68 | HR 74 | Ht 64.0 in | Wt 193.0 lb

## 2018-05-03 DIAGNOSIS — Z86010 Personal history of colonic polyps: Secondary | ICD-10-CM

## 2018-05-03 DIAGNOSIS — K219 Gastro-esophageal reflux disease without esophagitis: Secondary | ICD-10-CM | POA: Diagnosis not present

## 2018-05-03 MED ORDER — DEXLANSOPRAZOLE 60 MG PO CPDR: 60.0000 mg | DELAYED_RELEASE_CAPSULE | Freq: Every day | ORAL | 11 refills | Status: DC

## 2018-05-03 NOTE — Patient Instructions (Addendum)
If you are age 54 or older, your body mass index should be between 23-30. Your Body mass index is 33.13 kg/m. If this is out of the aforementioned range listed, please consider follow up with your Primary Care Provider.  If you are age 79 or younger, your body mass index should be between 19-25. Your Body mass index is 33.13 kg/m. If this is out of the aformentioned range listed, please consider follow up with your Primary Care Provider.   We have sent the following medications to your pharmacy for you to pick up at your convenience: Dexilant    Please purchase the following medications over the counter and take as directed: Fusion Plus  It has been recommended to you by your physician that you have a(n) EGD/Colonoscopy completed. Per your request, we did not schedule the procedure(s) today. Please contact our office at [phone] should you decide to have the procedure completed.  Thank you,  Dr. Jackquline Denmark

## 2018-05-03 NOTE — Progress Notes (Signed)
Chief Complaint:   Referring Provider:  Marco Collie, MD      ASSESSMENT AND PLAN;   #1. H/O colonic polyps, FH of colonic polyps.  #2. GERD  #3. IDA (with Hb-12.3, MCV-80 with ferritin 28 on 05/02/2018).  Has H/O B12 deficiency.  #4.  Mildly abnormal LFTs -likely due to fatty liver (AST/ALT 48/40 March 2020). [place] 09/2014.  Plan: - Dexilant 60mg  po qd #90 - Get CT report from [facility].  This was done yesterday.  It appears that it was a CT chest. - Fusion plus 1 tab po qd. - Add B12 to the labs drawn yesterday if possible. - Proceed with EGD/colon. Discussed risks & benefits. (Risks including rare perforation req laparotomy, bleeding after biopsies/polypectomy req blood transfusion, rare chance of missing neoplasms, risks of anesthesia/sedation). Benefits outweigh the risks. Patient agrees to proceed. All the questions were answered. Consent forms given for review.     HPI:    Kerri Jones is a 53 y.o. female  For follow-up visit. Has been having generalized malaise and fatigue Went to [place] [facility] and got blood work performed which showed borderline hemoglobin at 12 with iron studies consistent with mild iron deficiency anemia.  She had stable mildly elevated liver function tests. Has been told to get GI evaluation performed.  Has been having reflux with occasional epigastric discomfort.  Has been having breakthrough symptoms despite Dexilant.  Would like to get Dexilant refilled.  Had some shortness of breath.  Had CT chest done yesterday at [facility].  We do not have the results of the present time.  According to the patient it was negative.  Not so sure if she had CT of the abdomen and pelvis.  No weight loss.  No loss of appetite.  No diarrhea or constipation.  No melena or hematochezia.   Past GI procedures: -Colonoscopy 08/08/2013 (adult)-6 mm serrated adenoma status post polypectomy. -EGD 06/24/2012-healed esophageal erosions,  otherwise normal EGD, neg SB Bx. Past Medical History:  Diagnosis Date  . Breast cancer ([place]) 10/16/2013  . 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 Chronic constipation   . Family history of colon cancer   . Family history of prostate cancer   . GERD (gastroesophageal reflux disease)   . HTN (hypertension)   . Hypercholesteremia   . Plantar fasciitis   . Vitamin B 12 deficiency     Past Surgical History:  Procedure Laterality Date  . APPENDECTOMY    . BREAST LUMPECTOMY    . CESAREAN SECTION    . CHOLECYSTECTOMY    . COLONOSCOPY  08/08/2013   Colon polyp-status post polypectomy. Small internal hemorrhoids. Otherwise normal colonoscopy.   . ESOPHAGOGASTRODUODENOSCOPY  06/24/2012   Mild gastritis. Healed esophageal erosions. Otherwise normal EGD.   Marland Kitchen OOPHORECTOMY    . Throat Polyp removed    . TONSILLECTOMY      Family History  Problem Relation Age of Onset  . Lung cancer Father 66  . Cancer Maternal Aunt 12       GIST  . Colon cancer Maternal Uncle 65       dx twice at age 6  . Lung cancer Maternal Grandmother 20  . Prostate cancer Maternal Grandfather 64    Social History   Tobacco Use  . Smoking status: Never Smoker  . Smokeless tobacco: Never Used  Substance Use Topics  . Alcohol use: No  . Drug use: Not on file    Current  Outpatient Medications  Medication Sig Dispense Refill  . amLODipine (NORVASC) 2.5 MG tablet Take 2.5 mg by mouth daily.    . cholecalciferol (VITAMIN D) 1000 units tablet Take 6,000 Units by mouth daily.    . citalopram (CELEXA) 10 MG tablet Take 10 mg by mouth daily.    Marland Kitchen dexlansoprazole (DEXILANT) 60 MG capsule Take 60 mg by mouth daily.    Marland Kitchen ezetimibe (ZETIA) 10 MG tablet Take 10 mg by mouth daily.    Marland Kitchen L-Lysine 1000 MG TABS Take 1,000 mg by mouth daily.     No current facility-administered medications for this visit.     Allergies  Allergen Reactions  . Rituxan [Rituximab]   . Vicodin [Hydrocodone-Acetaminophen]      Review of Systems:  Constitutional: Denies fever, chills, diaphoresis, appetite change and fatigue.  HEENT: Denies photophobia, eye pain, redness, hearing loss, ear pain, congestion, sore throat, rhinorrhea, sneezing, mouth sores, neck pain, neck stiffness and tinnitus.   Respiratory: Denies SOB, DOE, cough, chest tightness,  and wheezing.   Cardiovascular: Denies chest pain, palpitations and leg swelling.  Genitourinary: Denies dysuria, urgency, frequency, hematuria, flank pain and difficulty urinating.  Musculoskeletal: Denies myalgias, back pain, joint swelling, arthralgias and gait problem.  Skin: No rash.  Neurological: Denies dizziness, seizures, syncope, weakness, light-headedness, numbness and headaches.  Hematological: Denies adenopathy. Easy bruising, personal or family bleeding history  Psychiatric/Behavioral: No anxiety or depression     Physical Exam:    BP 118/68   Pulse 74   Ht 5\' 4"  (1.626 m)   Wt 193 lb (87.5 kg)   BMI 33.13 kg/m  Filed Weights   05/03/18 1602  Weight: 193 lb (87.5 kg)   Constitutional:  Well-developed, in no acute distress. Psychiatric: Normal mood and affect. Behavior is normal. HEENT: Pupils normal.  Conjunctivae are normal. No scleral icterus. Neck supple.  Cardiovascular: Normal rate, regular rhythm. No edema Pulmonary/chest: Effort normal and breath sounds normal. No wheezing, rales or rhonchi. Abdominal: Soft, nondistended. Nontender. Bowel sounds active throughout. There are no masses palpable. No hepatomegaly. Rectal:  defered Neurological: Alert and oriented to person place and time. Skin: Skin is warm and dry. No rashes noted.    Carmell Austria, MD 05/03/2018, 4:05 PM  Cc: Marco Collie, MD

## 2018-07-23 ENCOUNTER — Other Ambulatory Visit: Payer: Self-pay

## 2018-07-23 DIAGNOSIS — C8519 Unspecified B-cell lymphoma, extranodal and solid organ sites: Secondary | ICD-10-CM

## 2018-07-23 DIAGNOSIS — N6310 Unspecified lump in the right breast, unspecified quadrant: Secondary | ICD-10-CM

## 2018-07-26 ENCOUNTER — Ambulatory Visit
Admission: RE | Admit: 2018-07-26 | Discharge: 2018-07-26 | Disposition: A | Discharge status: Home / Self Care | Payer: BC Managed Care – PPO | Source: Ambulatory Visit | Attending: Oncology | Admitting: Oncology

## 2018-07-26 ENCOUNTER — Other Ambulatory Visit: Payer: Self-pay

## 2018-07-26 DIAGNOSIS — C8519 Unspecified B-cell lymphoma, extranodal and solid organ sites: Secondary | ICD-10-CM

## 2018-07-26 DIAGNOSIS — N6310 Unspecified lump in the right breast, unspecified quadrant: Secondary | ICD-10-CM

## 2018-07-26 HISTORY — DX: Personal history of irradiation: Z92.3

## 2018-07-29 ENCOUNTER — Other Ambulatory Visit: Payer: Self-pay | Admitting: Oncology

## 2018-07-31 ENCOUNTER — Ambulatory Visit
Admission: RE | Admit: 2018-07-31 | Discharge: 2018-07-31 | Disposition: A | Discharge status: Home / Self Care | Payer: BC Managed Care – PPO | Source: Ambulatory Visit | Attending: Oncology | Admitting: Oncology

## 2018-07-31 ENCOUNTER — Other Ambulatory Visit: Payer: Self-pay

## 2018-07-31 DIAGNOSIS — C8519 Unspecified B-cell lymphoma, extranodal and solid organ sites: Secondary | ICD-10-CM

## 2018-07-31 DIAGNOSIS — N6310 Unspecified lump in the right breast, unspecified quadrant: Secondary | ICD-10-CM

## 2018-07-31 DIAGNOSIS — C8219 Follicular lymphoma grade II, extranodal and solid organ sites: Secondary | ICD-10-CM | POA: Insufficient documentation

## 2018-08-05 DIAGNOSIS — Z8572 Personal history of non-Hodgkin lymphomas: Secondary | ICD-10-CM | POA: Diagnosis not present

## 2018-08-14 DIAGNOSIS — Z8572 Personal history of non-Hodgkin lymphomas: Secondary | ICD-10-CM | POA: Diagnosis not present

## 2018-08-26 HISTORY — PX: BREAST LUMPECTOMY: SHX2

## 2018-09-11 DIAGNOSIS — C8589 Other specified types of non-Hodgkin lymphoma, extranodal and solid organ sites: Secondary | ICD-10-CM

## 2018-09-11 DIAGNOSIS — C50412 Malignant neoplasm of upper-outer quadrant of left female breast: Secondary | ICD-10-CM | POA: Diagnosis not present

## 2018-09-11 DIAGNOSIS — E538 Deficiency of other specified B group vitamins: Secondary | ICD-10-CM

## 2018-10-15 ENCOUNTER — Encounter: Payer: Self-pay | Admitting: Gastroenterology

## 2018-11-01 DIAGNOSIS — C8589 Other specified types of non-Hodgkin lymphoma, extranodal and solid organ sites: Secondary | ICD-10-CM

## 2018-11-01 DIAGNOSIS — E538 Deficiency of other specified B group vitamins: Secondary | ICD-10-CM | POA: Diagnosis not present

## 2018-11-01 DIAGNOSIS — C50412 Malignant neoplasm of upper-outer quadrant of left female breast: Secondary | ICD-10-CM

## 2018-11-20 ENCOUNTER — Encounter: Payer: Self-pay | Admitting: Gastroenterology

## 2018-12-25 ENCOUNTER — Ambulatory Visit (AMBULATORY_SURGERY_CENTER): Payer: BC Managed Care – PPO | Admitting: *Deleted

## 2018-12-25 ENCOUNTER — Other Ambulatory Visit: Payer: Self-pay

## 2018-12-25 VITALS — Temp 96.9°F | Ht 64.0 in | Wt 197.0 lb

## 2018-12-25 DIAGNOSIS — Z1159 Encounter for screening for other viral diseases: Secondary | ICD-10-CM

## 2018-12-25 DIAGNOSIS — Z86010 Personal history of colonic polyps: Secondary | ICD-10-CM

## 2018-12-25 DIAGNOSIS — K219 Gastro-esophageal reflux disease without esophagitis: Secondary | ICD-10-CM

## 2018-12-25 MED ORDER — CLENPIQ 10-3.5-12 MG-GM -GM/160ML PO SOLN: 1.0000 | ORAL | 0 refills | Status: DC

## 2018-12-25 NOTE — Progress Notes (Signed)
patient is here in-person for PV. Patient denies any allergies to eggs or soy. Patient denies any problems with anesthesia/sedation. Patient denies any oxygen use at home. Patient denies taking any diet/weight loss medications or blood thinners. Patient is not being treated for MRSA or C-diff. EMMI education assisgned to the patient for the procedure, this was explained and instructions given to patient. COVID-19 screening test is on 11/10, the pt is aware. Pt is aware that care partner will wait in the car during procedure; if they feel like they will be too hot or cold to wait in the car; they may wait in the 4 th floor lobby. Patient is aware to bring only one care partner. We want them to wear a mask (we do not have any that we can provide them), practice social distancing, and we will check their temperatures when they get here.  I did remind the patient that their care partner needs to stay in the parking lot the entire time and have a cell phone available, we will call them when the pt is ready for discharge. Patient will wear mask into building. Cl[place]pi[place] coupon given to the patient.

## 2018-12-27 ENCOUNTER — Encounter: Payer: Self-pay | Admitting: Gastroenterology

## 2019-01-07 ENCOUNTER — Ambulatory Visit (INDEPENDENT_AMBULATORY_CARE_PROVIDER_SITE_OTHER): Payer: BC Managed Care – PPO

## 2019-01-07 ENCOUNTER — Other Ambulatory Visit: Payer: Self-pay | Admitting: Gastroenterology

## 2019-01-07 DIAGNOSIS — Z1159 Encounter for screening for other viral diseases: Secondary | ICD-10-CM

## 2019-01-08 ENCOUNTER — Encounter: Payer: BC Managed Care – PPO | Admitting: Gastroenterology

## 2019-01-08 LAB — SARS CORONAVIRUS 2 (TAT 6-24 HRS): SARS Coronavirus 2: NEGATIVE

## 2019-01-10 ENCOUNTER — Ambulatory Visit (AMBULATORY_SURGERY_CENTER): Payer: BC Managed Care – PPO | Admitting: Gastroenterology

## 2019-01-10 ENCOUNTER — Encounter: Payer: BC Managed Care – PPO | Admitting: Gastroenterology

## 2019-01-10 ENCOUNTER — Other Ambulatory Visit: Payer: Self-pay

## 2019-01-10 ENCOUNTER — Encounter: Payer: Self-pay | Admitting: Gastroenterology

## 2019-01-10 VITALS — BP 114/56 | HR 60 | Temp 98.0°F | Resp 16 | Ht 64.0 in | Wt 197.0 lb

## 2019-01-10 DIAGNOSIS — K219 Gastro-esophageal reflux disease without esophagitis: Secondary | ICD-10-CM

## 2019-01-10 DIAGNOSIS — Z86010 Personal history of colonic polyps: Secondary | ICD-10-CM

## 2019-01-10 DIAGNOSIS — K2950 Unspecified chronic gastritis without bleeding: Secondary | ICD-10-CM | POA: Diagnosis not present

## 2019-01-10 DIAGNOSIS — K449 Diaphragmatic hernia without obstruction or gangrene: Secondary | ICD-10-CM

## 2019-01-10 DIAGNOSIS — K3189 Other diseases of stomach and duodenum: Secondary | ICD-10-CM | POA: Diagnosis not present

## 2019-01-10 DIAGNOSIS — D509 Iron deficiency anemia, unspecified: Secondary | ICD-10-CM | POA: Diagnosis not present

## 2019-01-10 DIAGNOSIS — K648 Other hemorrhoids: Secondary | ICD-10-CM

## 2019-01-10 DIAGNOSIS — K317 Polyp of stomach and duodenum: Secondary | ICD-10-CM | POA: Diagnosis not present

## 2019-01-10 MED ORDER — SODIUM CHLORIDE 0.9 % IV SOLN: 500.0000 mL | Freq: Once | INTRAVENOUS | Status: DC

## 2019-01-10 NOTE — Progress Notes (Signed)
PT taken to PACU. Monitors in place. VSS. Report given to RN. 

## 2019-01-10 NOTE — Progress Notes (Signed)
Called to room to assist during endoscopic procedure.  Patient ID and intended procedure confirmed with present staff. Received instructions for my participation in the procedure from the performing physician.  

## 2019-01-10 NOTE — Progress Notes (Signed)
Pt's states no medical or surgical changes since previsit or office visit. 

## 2019-01-10 NOTE — Patient Instructions (Signed)
Gastritis.  Hiatal hernia.  Gastric polyps (removed so..... NO ASPIRIN, IBUPROFEN, ALEVE OR OTHER NON-STEROIDAL ANTI-INFLAMMATORY DRUGS FOR 5 DAYS!!!!)     YOU HAD AN ENDOSCOPIC PROCEDURE TODAY AT [facility]:   Refer to the procedure report that was given to you for any specific questions about what was found during the examination.  If the procedure report does not answer your questions, please call your gastroenterologist to clarify.  If you requested that your care partner not be given the details of your procedure findings, then the procedure report has been included in a sealed envelope for you to review at your convenience later.  YOU SHOULD EXPECT: Some feelings of bloating in the abdomen. Passage of more gas than usual.  Walking can help get rid of the air that was put into your GI tract during the procedure and reduce the bloating. If you had a lower endoscopy (such as a colonoscopy or flexible sigmoidoscopy) you may notice spotting of blood in your stool or on the toilet paper. If you underwent a bowel prep for your procedure, you may not have a normal bowel movement for a few days.  Please Note:  You might notice some irritation and congestion in your nose or some drainage.  This is from the oxygen used during your procedure.  There is no need for concern and it should clear up in a day or so.  SYMPTOMS TO REPORT IMMEDIATELY:   Following lower endoscopy (colonoscopy or flexible sigmoidoscopy):  Excessive amounts of blood in the stool  Significant tenderness or worsening of abdominal pains  Swelling of the abdomen that is new, acute  Fever of 100F or higher   Following upper endoscopy (EGD)  Vomiting of blood or coffee ground material  New chest pain or pain under the shoulder blades  Painful or persistently difficult swallowing  New shortness of breath  Fever of 100F or higher  Black, tarry-looking stools  For urgent or emergent issues, a  gastroenterologist can be reached at any hour by calling [phone].   DIET:  We do recommend a small meal at first, but then you may proceed to your regular diet.  Drink plenty of fluids but you should avoid alcoholic beverages for 24 hours.  ACTIVITY:  You should plan to take it easy for the rest of today and you should NOT DRIVE or use heavy machinery until tomorrow (because of the sedation medicines used during the test).    FOLLOW UP: Our staff will call the number listed on your records 48-72 hours following your procedure to check on you and address any questions or concerns that you may have regarding the information given to you following your procedure. If we do not reach you, we will leave a message.  We will attempt to reach you two times.  During this call, we will ask if you have developed any symptoms of COVID 19. If you develop any symptoms (ie: fever, flu-like symptoms, shortness of breath, cough etc.) before then, please call [phone].  If you test positive for Covid 19 in the 2 weeks post procedure, please call and report this information to [place].    If any biopsies were taken you will be contacted by phone or by letter within the next 1-3 weeks.  Please call us at [phone] if you have not heard about the biopsies in 3 weeks.    SIGNATURES/CONFIDENTIALITY: You and/or your care partner have signed paperwork which will be entered into your  electronic medical record.  These signatures attest to the fact that that the information above on your After Visit Summary has been reviewed and is understood.  Full responsibility of the confidentiality of this discharge information lies with you and/or your care-partner.

## 2019-01-10 NOTE — Op Note (Signed)
[facility] Patient Name: Kerri Jones Procedure Date: 01/10/2019 9:35 AM MRN: [id number] Endoscopist: Jackquline Denmark , MD Age: 53 Referring MD:  Date of Birth: Oct 11, 1965 Gender: Female Account #: [id number] Procedure:                Colonoscopy Indications:              Unexplained iron deficiency anemia. History of                            colonic polyps. Medicines:                Monitored Anesthesia Care Procedure:                Pre-Anesthesia Assessment:                           - Prior to the procedure, a History and Physical                            was performed, and patient medications and                            allergies were reviewed. The patient's tolerance of                            previous anesthesia was also reviewed. The risks                            and benefits of the procedure and the sedation                            options and risks were discussed with the patient.                            All questions were answered, and informed consent                            was obtained. Prior Anticoagulants: The patient has                            taken no previous anticoagulant or antiplatelet                            agents. ASA Grade Assessment: II - A patient with                            mild systemic disease. After reviewing the risks                            and benefits, the patient was deemed in                            satisfactory condition to undergo the procedure.  After obtaining informed consent, the colonoscope                            was passed under direct vision. Throughout the                            procedure, the patient's blood pressure, pulse, and                            oxygen saturations were monitored continuously. The                            Colonoscope was introduced through the anus and                            advanced to the 4 cm into the ileum. The                      colonoscopy was performed without difficulty. The                            patient tolerated the procedure well. The quality                            of the bowel preparation was good. The terminal                            ileum, ileocecal valve, appendiceal orifice, and                            rectum were photographed. Scope In: 9:50:38 AM Scope Out: 10:02:09 AM Scope Withdrawal Time: 0 hours 9 minutes 9 seconds  Total Procedure Duration: 0 hours 11 minutes 31 seconds  Findings:                 The colon (entire examined portion) appeared normal.                           Non-bleeding internal hemorrhoids were found during                            retroflexion. The hemorrhoids were small.                           The terminal ileum contained a single localized                            non-bleeding erosion. No stigmata of recent                            bleeding were seen. Biopsies were taken with a cold                            forceps for histology.  The exam was otherwise without abnormality on                            direct and retroflexion views. Complications:            No immediate complications. Estimated Blood Loss:     Estimated blood loss: none. Impression:               - The entire examined colon is normal.                           - Non-bleeding internal hemorrhoids.                           - A single erosion in the terminal ileum. Biopsied.                            (likely d/t prep, r/o early crohn's)                           - The examination was otherwise normal on direct                            and retroflexion views. Recommendation:           - Patient has a contact number available for                            emergencies. The signs and symptoms of potential                            delayed complications were discussed with the                            patient. Return to normal activities  tomorrow.                            Written discharge instructions were provided to the                            patient.                           - Resume previous diet.                           - Continue present medications.                           - Await pathology results.                           - Repeat colonoscopy in 10 years for screening                            purposes. Earlier, if with any new problems or if  there is any change in family history.                           - Return to GI office in 12 weeks. Jackquline Denmark, MD 01/10/2019 10:08:34 AM This report has been signed electronically.

## 2019-01-10 NOTE — Progress Notes (Signed)
Temperature taken by L.C., VS taken by C.W. 

## 2019-01-10 NOTE — Op Note (Addendum)
[place] Patient Name: Kerri Jones Procedure Date: 01/10/2019 9:35 AM MRN: [id number] Endoscopist: Jackquline Denmark , MD Age: 53 Referring MD:  Date of Birth: 1965-07-20 Gender: Female Account #: [id number] Procedure:                Upper GI endoscopy Indications:              #1. GERD                           #2. IDA (with Hb-12.3, MCV-80 with ferritin 28 on                            05/02/2018). Has H/O B12 deficiency. Medicines:                Monitored Anesthesia Care Procedure:                Pre-Anesthesia Assessment:                           - Prior to the procedure, a History and Physical                            was performed, and patient medications and                            allergies were reviewed. The patient's tolerance of                            previous anesthesia was also reviewed. The risks                            and benefits of the procedure and the sedation                            options and risks were discussed with the patient.                            All questions were answered, and informed consent                            was obtained. Prior Anticoagulants: The patient has                            taken no previous anticoagulant or antiplatelet                            agents. ASA Grade Assessment: II - A patient with                            mild systemic disease. After reviewing the risks                            and benefits, the patient was deemed in  satisfactory condition to undergo the procedure.                           After obtaining informed consent, the endoscope was                            passed under direct vision. Throughout the                            procedure, the patient's blood pressure, pulse, and                            oxygen saturations were monitored continuously. The                            Endoscope was introduced through the mouth, and                advanced to the second part of duodenum. The upper                            GI endoscopy was accomplished without difficulty.                            The patient tolerated the procedure well. Scope In: Scope Out: Findings:                 The esophagus was mildly tortuous but normal.                            Biopsies were obtained from the proximal and distal                            esophagus with cold forceps for histology of                            suspected eosinophilic esophagitis.                           Z-line was well-defined at 35 cm. A 2 cm transient                            hiatal hernia was noted.                           Localized mild inflammation characterized by                            erythema was found in the gastric antrum. Biopsies                            were taken with a cold forceps for histology from                            the antrum, body of the stomach and the fundus.  A 8-10, 6 to 8 mm semi-sessile polyps with no                            bleeding and no stigmata of recent bleeding were                            found in the gastric body. 3 polyps were removed                            with a hot snare. Resection and retrieval were                            complete.                           The examined duodenum was normal. Biopsies for                            histology were taken with a cold forceps for                            evaluation of celiac disease. Complications:            No immediate complications. Estimated Blood Loss:     Estimated blood loss: none. Impression:               - Small hiatal hernia.                           - Gastritis. Biopsied.                           - A few gastric polyps. ( Resected and retrieved x                            3). Recommendation:           - Patient has a contact number available for                            emergencies. The signs  and symptoms of potential                            delayed complications were discussed with the                            patient. Return to normal activities tomorrow.                            Written discharge instructions were provided to the                            patient.                           - Resume previous diet.                           -  Continue present medications.                           - Await pathology results.                           - No aspirin, ibuprofen, naproxen, or other                            non-steroidal anti-inflammatory drugs for 5 days                            after polyp removal.                           - Return to GI clinic in 12 weeks.                           - D/W Maximino Greenland, MD 01/10/2019 10:13:56 AM This report has been signed electronically.

## 2019-01-14 ENCOUNTER — Telehealth: Payer: Self-pay | Admitting: *Deleted

## 2019-01-14 NOTE — Telephone Encounter (Signed)
  Follow up Call-  Call back number 01/10/2019  Post procedure Call Back phone  # [phone]  Permission to leave phone message Yes  Some recent data might be hidden     Patient questions:  Do you have a fever, pain , or abdominal swelling? No. Pain Score  0 *  Have you tolerated food without any problems? Yes.    Have you been able to return to your normal activities? Yes.    Do you have any questions about your discharge instructions: Diet   No. Medications  No. Follow up visit  No.  Do you have questions or concerns about your Care? No.  Actions: * If pain score is 4 or above: No action needed, pain <4. 1. Have you developed a fever since your procedure? no  2.   Have you had an respiratory symptoms (SOB or cough) since your procedure? no  3.   Have you tested positive for COVID 19 since your procedure no  4.   Have you had any family members/close contacts diagnosed with the COVID 19 since your procedure?  no   If yes to any of these questions please route to Joylene John, RN and Alphonsa Gin, Therapist, sports.

## 2019-01-16 ENCOUNTER — Encounter: Payer: Self-pay | Admitting: Gastroenterology

## 2019-04-09 ENCOUNTER — Telehealth: Payer: Self-pay | Admitting: Gastroenterology

## 2019-04-09 NOTE — Telephone Encounter (Signed)
I have completed this PA and waiting a response from the insurance company.

## 2019-04-09 NOTE — Telephone Encounter (Signed)
Pt states that [facility] told her that they faxed over PA form for Dexilant and are waiting for our response. Pt states that she only has three pills left.

## 2019-06-04 DIAGNOSIS — Z853 Personal history of malignant neoplasm of breast: Secondary | ICD-10-CM | POA: Diagnosis not present

## 2019-06-04 DIAGNOSIS — Z8572 Personal history of non-Hodgkin lymphomas: Secondary | ICD-10-CM

## 2019-06-04 DIAGNOSIS — Z08 Encounter for follow-up examination after completed treatment for malignant neoplasm: Secondary | ICD-10-CM

## 2019-06-09 ENCOUNTER — Encounter: Payer: Self-pay | Admitting: Gastroenterology

## 2019-06-09 ENCOUNTER — Other Ambulatory Visit: Payer: Self-pay

## 2019-06-09 ENCOUNTER — Other Ambulatory Visit (INDEPENDENT_AMBULATORY_CARE_PROVIDER_SITE_OTHER): Payer: BC Managed Care – PPO

## 2019-06-09 ENCOUNTER — Ambulatory Visit: Payer: BC Managed Care – PPO | Admitting: Gastroenterology

## 2019-06-09 VITALS — BP 116/76 | HR 74 | Temp 97.3°F | Ht 65.0 in | Wt 195.0 lb

## 2019-06-09 DIAGNOSIS — D509 Iron deficiency anemia, unspecified: Secondary | ICD-10-CM

## 2019-06-09 DIAGNOSIS — K219 Gastro-esophageal reflux disease without esophagitis: Secondary | ICD-10-CM

## 2019-06-09 DIAGNOSIS — Z86010 Personal history of colonic polyps: Secondary | ICD-10-CM | POA: Diagnosis not present

## 2019-06-09 MED ORDER — DEXILANT 30 MG PO CPDR: 30.0000 mg | DELAYED_RELEASE_CAPSULE | Freq: Every day | ORAL | 4 refills | Status: DC

## 2019-06-09 MED ORDER — FUSION PLUS PO CAPS: 1.0000 | ORAL_CAPSULE | Freq: Every day | ORAL | 3 refills | Status: DC

## 2019-06-09 NOTE — Patient Instructions (Addendum)
If you are age 55 or older, your body mass index should be between 23-30. Your Body mass index is 32.45 kg/m. If this is out of the aforementioned range listed, please consider follow up with your Primary Care Provider.  If you are age 46 or younger, your body mass index should be between 19-25. Your Body mass index is 32.45 kg/m. If this is out of the aformentioned range listed, please consider follow up with your Primary Care Provider.   We have sent the following medications to your pharmacy for you to pick up at your convenience: Dexilant 30 mg once daily  Please go to the lab at [facility] ([place] You will need to go to level "B", you do not need an appointment for this. Hours available are 7:30 am - 4:30 pm.   Follow the instructions on the Hemoccult cards and mail them back to [place] when you are finished or you may take them directly to the lab in the basement of the [place] building. We will call you with the results.    Thank you,  Dr. Jackquline Denmark

## 2019-06-09 NOTE — Progress Notes (Signed)
Chief Complaint: FU  Referring Provider:  Marco Collie, MD      ASSESSMENT AND PLAN;   #1. GERD with small HH  #2. IDA (with Hb-12.3, MCV-80 with ferritin 28 on 05/02/2018).  Has H/O B12 deficiency. 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  Negative PET scan.  Plan: - Decrease dose of Dexilant 55m po qd #90, 4 refills (may help with iron deficiency) - Fusion plus 1 tab po qd, #30, 6 refills. - Check B12.  She will get it done from Dr. BMarco Collieoffice. - Hemoccult cards x 3. If positive, CE followed by CT AP. - FU in 6 months - Should get Covid 19 vaccine.  Discussed in detail. - If continues to be anemic, would require bone marrow biopsy.     HPI:    Kerri Jones a 54y.o. female  For follow-up visit. Doing much better.  No nausea, vomiting, heartburn, regurgitation, odynophagia or dysphagia.  No significant diarrhea or constipation.  No melena or hematochezia. No unintentional weight loss. No abdominal pain.  Has been tolerating Dexilant.  Seen by Dr. MHinton Rao  Had hemoglobin 11.8 06/04/2019 (baseline hemoglobin 12.3 04/2018).  Iron studies consistent with iron deficiency anemia with ferritin 22, iron 76, TIBC 346, saturation 21%.  Her liver function tests have come back to normal.  Had EGD and colonoscopy November 2020 which was negative for any etiology of anemia.  She also has history of B12 deficiency and has been on B12 shots previously.  None recently.  No weight loss.  No loss of appetite.  No diarrhea or constipation.  No melena or hematochezia.    Past Medical History:  Diagnosis Date  . Breast [place] 10[place]  . Cancer ([facility] 2004/2006/2020   Low B Cell lymphoma  . Chronic constipation    . Family history of colon cancer   . Family history of prostate cancer   . GERD (gastroesophageal reflux disease)   . HTN (hypertension)   . Hypercholesteremia   . Personal history of radiation therapy   . Plantar fasciitis   . Vitamin B 12 deficiency     Past Surgical History:  Procedure Laterality Date  . APPENDECTOMY    . BREAST LUMPECTOMY Left 2015  . BREAST LUMPECTOMY Right 08/26/2018  . CESAREAN SECTION    . CHOLECYSTECTOMY    . COLONOSCOPY  08/08/2013   Colon polyp-status post polypectomy. Small internal hemorrhoids. Otherwise normal colonoscopy.   . ESOPHAGOGASTRODUODENOSCOPY  06/24/2012   Mild gastritis. Healed esophageal erosions. Otherwise normal EGD.   .Marland KitchenOOPHORECTOMY    . Throat Polyp removed    . TONSILLECTOMY      Family History  Problem Relation Age of Onset  . Colon polyps Mother   . Lung cancer Father 68 . Cancer Maternal Aunt 576      GIST  . Stomach cancer Maternal Aunt   . Colon cancer Maternal Uncle 545      dx twice at age 55 . Lung cancer Maternal Grandmother 790 . Prostate cancer Maternal Grandfather 669 . Esophageal cancer Neg Hx   . Rectal cancer Neg Hx     Social History   Tobacco Use  . Smoking status: Never Smoker  .  Smokeless tobacco: Never Used  Substance Use Topics  . Alcohol use: No  . Drug use: Not Currently    Current Outpatient Medications  Medication Sig Dispense Refill  . amLODipine (NORVASC) 2.5 MG tablet Take 2.5 mg by mouth daily.    Marland Kitchen CALCIUM-MAGNESIUM-ZINC PO Take 1 tablet by mouth daily.    . citalopram (CELEXA) 10 MG tablet Take 10 mg by mouth daily.    Marland Kitchen dexlansoprazole (DEXILANT) 60 MG capsule Take 1 capsule (60 mg total) by mouth daily. 30 capsule 11  . ezetimibe (ZETIA) 10 MG tablet Take 10 mg by mouth daily.    Marland Kitchen L-Lysine 1000 MG TABS Take 1,000 mg by mouth daily.    . rosuvastatin (CRESTOR) 5 MG tablet Take 5 mg by mouth daily.     . cholecalciferol (VITAMIN Kerri) 1000 units tablet Take 6,000 Units by  mouth daily.     No current facility-administered medications for this visit.    Allergies  Allergen Reactions  . Rituxan [Rituximab]   . Tape Itching    Skin redness.  . Vicodin [Hydrocodone-Acetaminophen]     Review of Systems:  neg     Physical Exam:    BP 116/76   Pulse 74   Temp (!) 97.3 F (36.3 C)   Ht _0  (1.651 m)   Wt 195 lb (88.5 kg)   BMI 32.45 kg/m  Filed Weights   06/09/19 1549  Weight: 195 lb (88.5 kg)   Constitutional:  Well-developed, in no acute distress. Psychiatric: Normal mood and affect. Behavior is normal. Pulmonary/chest: Effort normal and breath sounds normal. No wheezing, rales or rhonchi. Abdominal: Soft, nondistended. Nontender. Bowel sounds active throughout. There are no masses palpable. No hepatomegaly.  Reviewed Dr. Remi Deter message, last note and labs.    Carmell Austria, MD 06/09/2019, 4:00 PM  Cc: Marco Collie, MD  Dr Hosie Poisson.

## 2019-06-10 LAB — VITAMIN B12: Vitamin B-12: 250 pg/mL (ref 211–911)

## 2019-06-24 ENCOUNTER — Other Ambulatory Visit (INDEPENDENT_AMBULATORY_CARE_PROVIDER_SITE_OTHER): Payer: BC Managed Care – PPO

## 2019-06-24 DIAGNOSIS — Z86010 Personal history of colonic polyps: Secondary | ICD-10-CM | POA: Diagnosis not present

## 2019-06-24 DIAGNOSIS — K219 Gastro-esophageal reflux disease without esophagitis: Secondary | ICD-10-CM | POA: Diagnosis not present

## 2019-06-24 DIAGNOSIS — D509 Iron deficiency anemia, unspecified: Secondary | ICD-10-CM | POA: Diagnosis not present

## 2019-06-24 LAB — HEMOCCULT SLIDES (X 3 CARDS)
Fecal Occult Blood: NEGATIVE
OCCULT 1: NEGATIVE
OCCULT 2: NEGATIVE
OCCULT 3: NEGATIVE
OCCULT 4: NEGATIVE
OCCULT 5: NEGATIVE

## 2019-10-09 ENCOUNTER — Other Ambulatory Visit: Payer: Self-pay | Admitting: Gastroenterology

## 2019-12-03 ENCOUNTER — Other Ambulatory Visit: Payer: Self-pay | Admitting: Hematology and Oncology

## 2019-12-03 DIAGNOSIS — C50412 Malignant neoplasm of upper-outer quadrant of left female breast: Secondary | ICD-10-CM | POA: Diagnosis not present

## 2019-12-03 LAB — HEPATIC FUNCTION PANEL
ALT: 35 (ref 7–35)
AST: 38 — AB (ref 13–35)
Alkaline Phosphatase: 87 (ref 25–125)
Bilirubin, Total: 0.8

## 2019-12-03 LAB — BASIC METABOLIC PANEL
BUN: 13 (ref 4–21)
CO2: 25 — AB (ref 13–22)
Chloride: 107 (ref 99–108)
Creatinine: 1 (ref 0.5–1.1)
Glucose: 85
Potassium: 3.8 (ref 3.4–5.3)
Sodium: 142 (ref 137–147)

## 2019-12-03 LAB — CBC AND DIFFERENTIAL
HCT: 40 (ref 36–46)
Hemoglobin: 13.3 (ref 12.0–16.0)
Neutrophils Absolute: 2
Platelets: 196 (ref 150–399)
WBC: 3.8

## 2019-12-03 LAB — VITAMIN B12: Vitamin B-12: 495

## 2019-12-03 LAB — COMPREHENSIVE METABOLIC PANEL
Albumin: 4.4 (ref 3.5–5.0)
Calcium: 9.4 (ref 8.7–10.7)

## 2019-12-03 LAB — CBC: RBC: 4.79 (ref 3.87–5.11)

## 2020-04-13 ENCOUNTER — Telehealth: Payer: Self-pay

## 2020-04-13 NOTE — Telephone Encounter (Addendum)
I called pt back, & notified her of Kelli,PA's, response. Pt verbalized understanding that she should go to ED if shortness of breath is significant. She asked where she could get tested for COVID. I recommended she get in touch with her PCP or Urgent Care. Pt to get tested and call us back to schedule appt.    ----- Message from Marvia Pickles, PA-C sent at 04/13/2020  3:52 PM EST ----- Regarding: RE: Req an appt This is not really new, she has issues on and off for 2 years now. If significant SOB or CP, she needs to come to ED. If not I would like her to be tested for COVID (I don't think she's been vaccinated). We can see her after that. Thank you ----- Message ----- From: Dairl Ponder, RN Sent: 04/13/2020   3:22 PM EST To: Marvia Pickles, PA-C Subject: Req an appt                                    Pt called requesting an appt. She is very nervous, as she has started having intermittent pain under her breast /rib cage bone area bilaterally. States the pain is dull, 7 uncomfortable. She isn't taking anything for pain. She states, "with the lymphoma, you never know where it might pop it". Pt reports she has had some increased SOB off and on since before Christmas. "I have an apple watch and it has told me to take a breath". No chest pain. No fevers. No swelling of glands/nodes. No night sweats. No abdominal pain. She does admit to some abdominal bloating, w/occasional constipation (has 3-4 BMS/wk). No hematuria. No hematochezia.  She would like to be seen before scheduled appt in April 2022. [phone]

## 2020-04-14 ENCOUNTER — Telehealth: Payer: Self-pay

## 2020-04-14 NOTE — Telephone Encounter (Signed)
Pt called in to ask if she needed to get the rapid COVID test, or the PCR?

## 2020-04-16 ENCOUNTER — Telehealth: Payer: Self-pay

## 2020-04-16 NOTE — Telephone Encounter (Addendum)
I spoke with pt. She would like to see Melissa, appt given for Monday @ 1p labs, 130p with Melissa.  Melissa, she will bring the negative COVID test with her. Sending this to you , so you can get caught up on it. Thanks!   ----- Message from Derwood Kaplan, MD sent at 04/16/2020 12:48 PM EST ----- Regarding: RE: APPT So we can schedule appt.  I don't have anything until next Friday afternoon & I don't know if Pamala Hurry is holding that for new pt.  I have several openings the 1st week of March, Wed, Thur, Fri. The other option is for Melissa to eval and see if scan is indicated, or does she want [place] to order a scan of the chest while waiting for appt?  (would also need labs) ----- Message ----- From: Dairl Ponder, RN Sent: 04/16/2020  12:19 PM EST To: Derwood Kaplan, MD Subject: APPT                                           Pt called back today @ 1159 04/16/20- , her COVID PCR is negative.    I called pt back, & notified her of Kelli,PA's, response. Pt verbalized understanding that she should go to ED if shortness of breath is significant. She asked where she could get tested for COVID. I recommended she get in touch with her PCP or Urgent Care. Pt to get tested and call us back to schedule appt.    ----- Message from Marvia Pickles, PA-C sent at 04/13/2020  3:52 PM EST ----- Regarding: RE: Req an appt This is not really new, she has issues on and off for 2 years now. If significant SOB or CP, she needs to come to ED. If not I would like her to be tested for COVID (I don't think she's been vaccinated). We can see her after that. Thank you ----- Message ----- From: Dairl Ponder, RN Sent: 04/13/2020   3:22 PM EST To: Marvia Pickles, PA-C Subject: Req an appt                                    Pt called requesting an appt. She is very nervous, as she has started having intermittent pain under her breast /rib cage bone area bilaterally. States the pain is dull, 7  uncomfortable. She isn't taking anything for pain. She states, "with the lymphoma, you never know where it might pop it". Pt reports she has had some increased SOB off and on since before Christmas. "I have an apple watch and it has told me to take a breath". No chest pain. No fevers. No swelling of glands/nodes. No night sweats. No abdominal pain. She does admit to some abdominal bloating, w/occasional constipation (has 3-4 BMS/wk). No hematuria. No hematochezia.  She would like to be seen before scheduled appt in April 2022. [phone]

## 2020-04-16 NOTE — Progress Notes (Signed)
[facility]  [address] [place],  [place]  [address] [phone]  Clinic Day:  04/19/2020  Referring physician: Derwood Kaplan, MD  This document serves as a record of services personally performed by Hosie Poisson, MD. It was created on their behalf by Curry,Lauren E, a trained medical scribe. The creation of this record is based on the scribe's personal observations and the provider's statements to them.  CHIEF COMPLAINT:  CC: History of low-grade cutaneous B-cell lymphoma  Current Treatment:  Surveillance   HISTORY OF PRESENT ILLNESS:  Kerri Jones is a 55 y.o. female with a remote history of a low-grade cutaneous B-cell lymphoma, which was in remission for 10 years.  This was originally found in a skin lesion in 2004, biopsy of which was felt to represent lymphoproliferative tissue, but not definitively lymphoma.  A recurrent skin lesion of her back in 2006 was excised and pathology revealed a low-grade B-cell lymphoma.  She did not have evidence of disease elsewhere.  She received chemotherapy with CVP/rituximab.  She had an allergic reaction to rituximab with her first dose, so this was discontinued.  She had an excellent response to CVP alone.  This was completed in June 2006.    She was found to have stage IA (T1c N0 M0) hormone receptor positive left breast cancer in August 2015.  She was treated with lumpectomy.  Pathology revealed a 1.1 cm, grade 2, invasive ductal carcinoma with 3 negative nodes and a close deep margin.  Estrogen and progesterone receptors were positive and her 2 Neu negative.  Ki 67 was 28%.  Oncotype DX testing revealed a recurrence score of 17, which is in the low risk category, so chemotherapy was not recommended.  She received adjuvant radiation to the left breast, which was completed in November 2015. She was placed on hormonal therapy with tamoxifen in December, as she was still premenopausal.   Within a few months, she had severe toxicities of fatigue, lightheadedness, dyspnea, and paresthesias of her hands, so tamoxifen was discontinued.  The symptoms abated when she stopped the medication.  Rather than undergo Lupron, the patient underwent a bilateral salpingo-oophorectomy in June 2016.  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  Staging scans did not reveal any other areas of disease, so we opted for observation only.  We did eventually try her on an aromatase inhibitor for her breast cancer, but she did not tolerate this either.  She previously had B12 deficiency treated with B12 injections, but these were discontinued and she does not take oral B12 supplement.  She has not had evidence of recurrent B12 deficiency.  She has also had abnormal liver transaminases and intermittent leukopenia.  Probable hepatic steatosis was noted on CT abdomen and pelvis, as well as abdominal ultrasound.  She had previous iron deficiency, which resolved.    Due to her personal and family history of cancer she did undergo testing for hereditary cancer syndromes with the [employer] Cancer Next gene panel.  This did not reveal any clinically significant mutation or variants of uncertain significance.    Bilateral mammogram in August 2017 did not reveal any evidence of malignancy.  Ultrasound of the left breast was done due to firmness of the lumpectomy site and revealed a 3.9 cm oval mass which was circumscribed with layering debris consistent with a complex seroma.  This was stable in size and felt to be benign.  It was recommended that she have a repeat ultrasound in 6 months.  This was done March 2018 and revealed a decrease in the seroma .  The patient was seen in March 2018 because she noticed 2 red spots on the anterior chest, which appeared very similar to her prior cutaneous lymphoma.  She was referred for biopsy and the mid chest shave biopsy was a lichenoid keratosis  and the skin of her right upper arm was a subcutaneous area of granulomatous and mixed inflammation.  In March 2020 CTA chest did not reveal any evidence of pulmonary embolism or other acute abnormality.  Echocardiogram revealed normal left ventricular size and function with an ejection fraction of 60-65%.  She was added to the schedule on May 26th, as she phoned reporting a new right breast mass.  Right diagnostic mammogram and ultrasound were performed  The mammogram was negative, but the ultrasound revealed a 3 cm in diameter hypoechoic mass at 9 o'clock 1 cm from the nipple.  Biopsy was consistent with a lymphoproliferative disorder.  After reviewing the report of the stains and description, we felt this represented recurrence of her lymphoma.  PET scan revealed hypermetabolic activity in the right breast with an SUV of 5.4, but no other areas of hyper metabolism.  She was treated with right lumpectomy in June.  There was a new skin lesion of the upper chest and Dr. Amalia Hailey biopsied this at the time of her right breast lumpectomy.  The skin lesion was a benign keratosis.  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  Margins were clear.  As this was completely excised, no further therapy for the lymphoma was recommended.  She had COVID-19 in July 2020, but had very minimal symptoms.    She was added to the schedule in early February 2021 due to palpable mass in the right breast under the nipple, which is mildly tender.  She denied other palpable breast masses.  Diagnostic bilateral mammogram and bilateral breast ultrasound from February 17th revealed a 2.8 cm retroareolar postoperative seroma containing a small amount of entrapped fat with overlying postsurgical scar tissue.  Normal appearing breast tissue throughout the upper outer left breast including dense glandular tissue  with a rounded peak, corresponding to the area of palpable nodularity and tenderness.  There was a no  evidence of malignancy.  She is up-to-date on pelvic examination, as well as colonoscopy.  Colonoscopy, as well as EGD were done in November 2020.  When she was seen in April of 2021, she had mild anemia with a low normal ferritin.  We recommended increasing iron in her diet and she was on an iron supplement for a time.  INTERVAL HISTORY:  Montina is here for routine follow up and reports occasions of dull bilateral flank pain, left worse than right, which radiates to the midline, as well as abdominal bloating.  She denies fever, chills, dysuria or increased urinary frequency.  We will obtain a urinalysis and urine culture as well as a CT chest, abdomen and pelvis for further evaluation.  She denies skin lesions, or breast abnormalities other than occasional fullness of the upper left breast.  We will schedule her for annual mammogram, but will make this a diagnostic due to the findings in both breasts, along with left breast ultrasound as she is due now.  She notes intermittent hot flashes and cold chills.  Blood counts and chemistries are unremarkable except for a mildly elevated SGOT of 39,  which fluctuates up and down.  Her  appetite is fair, and she has gained 2 pounds since her last visit.  She denies nausea, vomiting, bowel issues, or abdominal pain.  She denies sore throat, cough, dyspnea, or chest pain.  REVIEW OF SYSTEMS:  Review of Systems  Constitutional: Negative.  Negative for appetite change, chills, fatigue, fever and unexpected weight change.  HENT:  Negative.   Eyes: Negative.   Respiratory: Negative.  Negative for chest tightness, cough, hemoptysis, shortness of breath and wheezing.   Cardiovascular: Negative.  Negative for chest pain, leg swelling and palpitations.  Gastrointestinal: Positive for abdominal distention (and bloating, intermittent), abdominal pain (bilateral upper quadrant) and diarrhea (mild). Negative for blood in stool, constipation, nausea and vomiting.  Endocrine:  Positive for hot flashes.  Genitourinary: Negative for difficulty urinating, dysuria, frequency and hematuria.   Musculoskeletal: Negative for arthralgias, back pain, flank pain, gait problem and myalgias.  Skin: Negative.  Negative for itching, rash and wound.  Neurological: Negative.  Negative for dizziness, gait problem, headaches, light-headedness, numbness, seizures and speech difficulty.  Hematological: Negative.   Psychiatric/Behavioral: Negative.  Negative for depression and sleep disturbance. The patient is not nervous/anxious.      VITALS:  Blood pressure (!) 141/91, pulse 72, temperature 98.3 F (36.8 C), temperature source Oral, resp. rate 18, height 5\' 5"  (1.651 m), weight 197 lb 12.8 oz (89.7 kg), SpO2 98 %.  Wt Readings from Last 3 Encounters:  04/19/20 197 lb 12.8 oz (89.7 kg)  06/09/19 195 lb (88.5 kg)  01/10/19 197 lb (89.4 kg)    Body mass index is 32.92 kg/m.  Performance status (ECOG): 1 - Symptomatic but completely ambulatory  PHYSICAL EXAM:  Physical Exam Constitutional:      General: She is not in acute distress.    Appearance: Normal appearance. She is normal weight.  HENT:     Head: Normocephalic and atraumatic.  Eyes:     General: No scleral icterus.    Extraocular Movements: Extraocular movements intact.     Conjunctiva/sclera: Conjunctivae normal.     Pupils: Pupils are equal, round, and reactive to light.  Cardiovascular:     Rate and Rhythm: Normal rate and regular rhythm.     Pulses: Normal pulses.     Heart sounds: Normal heart sounds. No murmur heard. No friction rub. No gallop.   Pulmonary:     Effort: Pulmonary effort is normal. No respiratory distress.     Breath sounds: Normal breath sounds.  Chest:     Comments: Above the left breast scar there is some nodularity, which is firm.  Bilateral breasts are without masses. Abdominal:     General: Bowel sounds are normal. There is no distension.     Palpations: Abdomen is soft. There is no  hepatomegaly, splenomegaly or mass.     Tenderness: There is abdominal tenderness (diffuse of the central abdomen to left of the midline).  Musculoskeletal:        General: Normal range of motion.     Cervical back: Normal range of motion and neck supple.     Right lower leg: No edema.     Left lower leg: No edema.  Lymphadenopathy:     Cervical: No cervical adenopathy.  Skin:    General: Skin is warm and dry.  Neurological:     General: No focal deficit present.     Mental Status: She is alert and oriented to person, place, and time. Mental status is at baseline.  Psychiatric:        Mood and Affect: Mood normal.        Behavior: Behavior normal.        Thought Content: Thought content normal.        Judgment: Judgment normal.     LABS:   CBC Latest Ref Rng & Units 12/03/2019  WBC - 3.8  Hemoglobin 12.0 - 16.0 13.3  Hematocrit 36 - 46 40  Platelets 150 - 399 196   CMP Latest Ref Rng & Units 12/03/2019  BUN 4 - 21 13  Creatinine 0.5 - 1.1 1.0  Sodium 137 - 147 142  Potassium 3.4 - 5.3 3.8  Chloride 99 - 108 107  CO2 13 - 22 25(A)  Calcium 8.7 - 10.7 9.4  Alkaline Phos 25 - 125 87  AST 13 - 35 38(A)  ALT 7 - 35 35     No results found for: LDH   STUDIES:  No results found.   Allergies:  Allergies  Allergen Reactions  . Hydrocodone-Acetaminophen     Other reaction(s): Other (see comments)  . Other Itching    Skin redness.  . Rituxan [Rituximab]   . Tape Itching    Skin redness.  . Vicodin [Hydrocodone-Acetaminophen]     Current Medications: Current Outpatient Medications  Medication Sig Dispense Refill  . amLODipine (NORVASC) 2.5 MG tablet Take 2.5 mg by mouth daily.    Marland Kitchen CALCIUM-MAGNESIUM-ZINC PO Take 1 tablet by mouth daily.    . cholecalciferol (VITAMIN D) 1000 units tablet Take 6,000 Units by mouth daily.    . citalopram (CELEXA) 10 MG tablet Take 10 mg by mouth daily.    Marland Kitchen Dexlansoprazole (DEXILANT) 30 MG capsule Take 1 capsule (30 mg total) by  mouth daily. 90 capsule 4  . diclofenac Sodium (VOLTAREN) 1 % GEL Voltaren 1 % topical gel  APPLY 4 GRAMS TO THE AFFECTED AREA(S) BY TOPICAL ROUTE 4 TIMES PER DAY    . ezetimibe (ZETIA) 10 MG tablet Take 10 mg by mouth daily.    . Iron-FA-B Cmp-C-Biot-Probiotic (FUSION PLUS) CAPS TAKE 1 CAPSULE BY MOUTH ONCE DAILY 30 capsule 3  . L-Lysine 1000 MG TABS Take 1,000 mg by mouth daily.    . rosuvastatin (CRESTOR) 5 MG tablet Take 5 mg by mouth daily.      No current facility-administered medications for this visit.     ASSESSMENT & PLAN:   Assessment:   1. History of low-grade cutaneous lymphoma diagnosed in 2004 and treated with surgery and chemotherapy many years ago.  2. History of IA hormone receptor positive left breast cancer in August 2015 treated with surgery and radiation.  She did not tolerate hormonal therapy.  She remains without evidence of recurrence.    3. 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    4. 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 This was treated with lumpectomy.  PET was otherwise negative, so no further therapy was recommended.  5. Subareolar right breast mass, which is a seroma, confirmed by mammogram and ultrasound.   6. Mild elevation of the AST, which has nearly normalized, likely due to steatohepatitis.  7.  New, bilateral upper abdominal pain which is nonspecific.  We will check a urine for infection, but I feel it is appropriate to do CT scans of chest, abdomen and pelvis, especially with her history of multiple malignancies.  8.  Nodularity of bilateral lumpectomy scars.  I  will evaluate this further with bilateral ultrasounds.  Plan: She is due for bilateral screening mammogram, which I will schedule, along with bilateral breast ultrasounds to evaluate the nodular scars.  With her symptoms of bilateral upper quadrant abdominal pain and abdominal bloating, we will obtain a urinalysis,  urine culture, and CT chest, abdomen and pelvis for further evaluation.  If all is well, we will plan to see her back in 6 months with CBC, comprehensive metabolic panel and LDH for reevaluation.  The patient understands the plans discussed today and is in agreement with them.  She knows to contact our office if she develops concerns prior to her next appointment.   I provided 20 minutes of face-to-face time during this this encounter and > 50% was spent counseling as documented under my assessment and plan.    Derwood Kaplan, MD [facility] [address] [place] [address] Dept: [phone] Dept Fax: [phone]   I, Rita Ohara, am acting as scribe for Derwood Kaplan, MD  I have reviewed this report as typed by the medical scribe, and it is complete and accurate.  Hermina Barters

## 2020-04-19 ENCOUNTER — Other Ambulatory Visit: Payer: BC Managed Care – PPO

## 2020-04-19 ENCOUNTER — Other Ambulatory Visit: Payer: Self-pay

## 2020-04-19 ENCOUNTER — Encounter: Payer: Self-pay | Admitting: Oncology

## 2020-04-19 ENCOUNTER — Other Ambulatory Visit: Payer: Self-pay | Admitting: Oncology

## 2020-04-19 ENCOUNTER — Ambulatory Visit: Payer: BC Managed Care – PPO | Admitting: Hematology and Oncology

## 2020-04-19 ENCOUNTER — Other Ambulatory Visit: Payer: Self-pay | Admitting: Hematology and Oncology

## 2020-04-19 ENCOUNTER — Inpatient Hospital Stay: Payer: BC Managed Care – PPO

## 2020-04-19 ENCOUNTER — Inpatient Hospital Stay: Payer: BC Managed Care – PPO | Attending: Hematology and Oncology | Admitting: Oncology

## 2020-04-19 VITALS — BP 141/91 | HR 72 | Temp 98.3°F | Resp 18 | Ht 65.0 in | Wt 197.8 lb

## 2020-04-19 DIAGNOSIS — C50212 Malignant neoplasm of upper-inner quadrant of left female breast: Secondary | ICD-10-CM | POA: Diagnosis not present

## 2020-04-19 DIAGNOSIS — C8590 Non-Hodgkin lymphoma, unspecified, unspecified site: Secondary | ICD-10-CM

## 2020-04-19 DIAGNOSIS — C8599 Non-Hodgkin lymphoma, unspecified, extranodal and solid organ sites: Secondary | ICD-10-CM

## 2020-04-19 DIAGNOSIS — Z170 Estrogen receptor positive status [ER+]: Secondary | ICD-10-CM

## 2020-04-19 DIAGNOSIS — R1012 Left upper quadrant pain: Secondary | ICD-10-CM

## 2020-04-19 DIAGNOSIS — C50412 Malignant neoplasm of upper-outer quadrant of left female breast: Secondary | ICD-10-CM

## 2020-04-19 LAB — HEPATIC FUNCTION PANEL
ALT: 35 (ref 7–35)
AST: 39 — AB (ref 13–35)
Alkaline Phosphatase: 84 (ref 25–125)
Bilirubin, Total: 0.7

## 2020-04-19 LAB — CBC AND DIFFERENTIAL
HCT: 39 (ref 36–46)
Hemoglobin: 13.2 (ref 12.0–16.0)
Neutrophils Absolute: 2.42
Platelets: 187 (ref 150–399)
WBC: 4.4

## 2020-04-19 LAB — COMPREHENSIVE METABOLIC PANEL
Albumin: 4.4 (ref 3.5–5.0)
Calcium: 9.2 (ref 8.7–10.7)

## 2020-04-19 LAB — BASIC METABOLIC PANEL
BUN: 13 (ref 4–21)
CO2: 26 — AB (ref 13–22)
Chloride: 106 (ref 99–108)
Creatinine: 1 (ref 0.5–1.1)
Glucose: 97
Potassium: 3.9 (ref 3.4–5.3)
Sodium: 141 (ref 137–147)

## 2020-04-19 LAB — CBC: RBC: 4.76 (ref 3.87–5.11)

## 2020-04-26 ENCOUNTER — Encounter: Payer: Self-pay | Admitting: Oncology

## 2020-04-27 ENCOUNTER — Telehealth: Payer: Self-pay

## 2020-04-27 ENCOUNTER — Telehealth: Payer: Self-pay | Admitting: Oncology

## 2020-04-27 NOTE — Telephone Encounter (Signed)
04/27/20 left VM- Sched CT Scans

## 2020-04-27 NOTE — Telephone Encounter (Signed)
-----   Message from Derwood Kaplan, MD sent at 04/26/2020  7:11 PM EST ----- Regarding: RE: Results Urine cult neg but I have not seen CT report, was it done today?  Will call her when I see it ----- Message ----- From: Belva Chimes, LPN Sent: 5/46/5035   4:38 PM EST To: Derwood Kaplan, MD Subject: Results                                        Patient calling wanting urine culture results and CT scan results. I was not sure what to tell her.

## 2020-04-27 NOTE — Telephone Encounter (Signed)
Patient called and notified. Patient also states the CT scan has not been scheduled yet due to authorization. Patient needing to know when it is.

## 2020-04-28 ENCOUNTER — Encounter: Payer: Self-pay | Admitting: Oncology

## 2020-05-06 ENCOUNTER — Other Ambulatory Visit: Payer: Self-pay | Admitting: Oncology

## 2020-05-06 DIAGNOSIS — C8599 Non-Hodgkin lymphoma, unspecified, extranodal and solid organ sites: Secondary | ICD-10-CM

## 2020-05-07 ENCOUNTER — Encounter: Payer: Self-pay | Admitting: Oncology

## 2020-05-10 ENCOUNTER — Other Ambulatory Visit (HOSPITAL_COMMUNITY)
Admission: RE | Admit: 2020-05-10 | Discharge: 2020-05-10 | Disposition: A | Discharge status: Home / Self Care | Payer: BC Managed Care – PPO | Source: Ambulatory Visit | Attending: Oncology | Admitting: Oncology

## 2020-05-10 DIAGNOSIS — Z170 Estrogen receptor positive status [ER+]: Secondary | ICD-10-CM | POA: Diagnosis not present

## 2020-05-10 DIAGNOSIS — N6459 Other signs and symptoms in breast: Secondary | ICD-10-CM | POA: Diagnosis not present

## 2020-05-12 ENCOUNTER — Other Ambulatory Visit: Payer: Self-pay

## 2020-05-12 ENCOUNTER — Encounter: Payer: Self-pay | Admitting: Oncology

## 2020-05-12 ENCOUNTER — Other Ambulatory Visit: Payer: Self-pay | Admitting: Oncology

## 2020-05-12 ENCOUNTER — Inpatient Hospital Stay: Payer: BC Managed Care – PPO | Attending: Hematology and Oncology | Admitting: Oncology

## 2020-05-12 VITALS — BP 173/96 | HR 64 | Temp 98.0°F | Resp 18 | Ht 65.0 in | Wt 201.7 lb

## 2020-05-12 DIAGNOSIS — C8219 Follicular lymphoma grade II, extranodal and solid organ sites: Secondary | ICD-10-CM | POA: Diagnosis not present

## 2020-05-12 LAB — SURGICAL PATHOLOGY

## 2020-05-12 NOTE — Progress Notes (Signed)
Kerri Jones  [address] [place],  [place]  [address] [phone]  Clinic Day:  05/12/2020  Referring physician: Marco Collie, MD  This document serves as a record of services personally performed by Kerri Poisson, MD. It was created on their behalf by Curry,Lauren Jones, a trained medical scribe. The creation of this record is based on the scribe's personal observations and the provider's statements to them.  CHIEF COMPLAINT:  CC: History of low-grade cutaneous B-cell lymphoma  Current Treatment:  Surveillance   HISTORY OF PRESENT ILLNESS:  Kerri Jones is a 55 y.o. female with a remote history of a low-grade cutaneous B-cell lymphoma, which was in remission for 10 years.  This was originally found in a skin lesion in 2004, biopsy of which was felt to represent lymphoproliferative tissue, but not definitively lymphoma.  A recurrent skin lesion of her back in 2006 was excised and pathology revealed a low-grade B-cell lymphoma.  She did not have evidence of disease elsewhere.  She received chemotherapy with CVP/rituximab.  She had an allergic reaction to rituximab with her first dose, so this was discontinued.  She had an excellent response to CVP alone.  This was completed in June 2006.    She was found to have stage IA (T1c N0 M0) hormone receptor positive left breast cancer in August 2015.  She was treated with lumpectomy.  Pathology revealed a 1.1 cm, grade 2, invasive ductal carcinoma with 3 negative nodes and a close deep margin.  Estrogen and progesterone receptors were positive and her 2 Neu negative.  Ki 67 was 28%.  Oncotype DX testing revealed a recurrence score of 17, which is in the low risk category, so chemotherapy was not recommended.  She received adjuvant radiation to the left breast, which was completed in November 2015. She was placed on hormonal therapy with tamoxifen in December, as she was still premenopausal.  Within a few  months, she had severe toxicities of fatigue, lightheadedness, dyspnea, and paresthesias of her hands, so tamoxifen was discontinued.  The symptoms abated when she stopped the medication.  Rather than undergo Lupron, the patient underwent a bilateral salpingo-oophorectomy in June 2016.  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  Staging scans did not reveal any other areas of disease, so we opted for observation only.  We did eventually try her on an aromatase inhibitor for her breast cancer, but she did not tolerate this either.  She previously had B12 deficiency treated with B12 injections, but these were discontinued and she does not take oral B12 supplement.  She has not had evidence of recurrent B12 deficiency.  She has also had abnormal liver transaminases and intermittent leukopenia.  Probable hepatic steatosis was noted on CT abdomen and pelvis, as well as abdominal ultrasound.  She had previous iron deficiency, which resolved.    Due to her personal and family history of cancer she did undergo testing for hereditary cancer syndromes with the [employer] Cancer Next gene panel.  This did not reveal any clinically significant mutation or variants of uncertain significance.    Bilateral mammogram in August 2017 did not reveal any evidence of malignancy.  Ultrasound of the left breast was done due to firmness of the lumpectomy site and revealed a 3.9 cm oval mass which was circumscribed with layering debris consistent with a complex seroma.  This was stable in size and felt to be benign.  It was recommended that she have a repeat ultrasound in 6 months.  This was done March 2018 and revealed a decrease in the seroma .  The patient was seen in March 2018 because she noticed 2 red spots on the anterior chest, which appeared very similar to her prior cutaneous lymphoma.  She was referred for biopsy and the mid chest shave biopsy was a lichenoid keratosis and the skin of  her right upper arm was a subcutaneous area of granulomatous and mixed inflammation.  In March 2020 CTA chest did not reveal any evidence of pulmonary embolism or other acute abnormality.  Echocardiogram revealed normal left ventricular size and function with an ejection fraction of 60-65%.  She was added to the schedule on May 26th, as she phoned reporting a new right breast mass.  Right diagnostic mammogram and ultrasound were performed  The mammogram was negative, but the ultrasound revealed a 3 cm in diameter hypoechoic mass at 9 o'clock 1 cm from the nipple.  Biopsy was consistent with a lymphoproliferative disorder.  After reviewing the report of the stains and description, we felt this represented recurrence of her lymphoma.  PET scan revealed hypermetabolic activity in the right breast with an SUV of 5.4, but no other areas of hyper metabolism.  She was treated with right lumpectomy in June.  There was a new skin lesion of the upper chest and Dr. Amalia Hailey biopsied this at the time of her right breast lumpectomy.  The skin lesion was a benign keratosis.  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  Margins were clear.  As this was completely excised, no further therapy for the lymphoma was recommended.  She had COVID-19 in July 2020, but had very minimal symptoms.    In early February 2021 she had a palpable mass in the right breast under the nipple, which was mildly tender.  Diagnostic bilateral mammogram and bilateral breast ultrasound from February 17th revealed a 2.8 cm retroareolar postoperative seroma containing a small amount of entrapped fat with overlying postsurgical scar tissue.  Normal appearing breast tissue throughout the upper outer left breast including dense glandular tissue  with a rounded peak, corresponding to the area of palpable nodularity and tenderness.  There was a no evidence of malignancy.  She is up-to-date on pelvic examination, as well as  colonoscopy.  Colonoscopy, as well as EGD were done in November 2020 by Dr. Lyndel Safe.  When she was seen in April of 2021, she had mild anemia with a low normal ferritin.  We recommended increasing iron in her diet and she was on an iron supplement for a time.  In February 2022 she was seen for abdominal complaints but her CT scan of chest, abdomen and pelvis was negative.  I have recommended that she see Dr.Gupta for follow up.  She was scheduled for diagnostic annual mammography since I felt some nodularity above her left breast lumpectomy scar.  INTERVAL HISTORY:  Takisha is here for follow up after abnormal diagnostic mammogram.  Bilateral diagnostic mammogram and ultrasound from March 9th revealed suspicious hypoechoic vascular material likely within ducts of the lower outer retroareolar right breast, as well as 1 thickened right axillary lymph node with a cortex measuring 5 mm.  No abnormalities were found at the surgical site in the upper inner left breast.  Ultrasound guided biopsies of the right breast and right axillary node were obtained on March 14th but official pathology is still pending, however, this seems  to be consistent with lymphoma of the breast as it is being read as at least atypical lymphoid proliferation.  We will now plan to obtain PET imaging to rule out further evidence of disease elsewhere.  She states that she is doing well after her biopsies, but does report anxiety.  She is scheduled with Dr. Lyndel Safe in the near future for GI evaluation.  Her  appetite is good, and she has gained nearly 4 pounds since her last visit.  She denies fever, chills or other signs of infection.  She denies nausea, vomiting, bowel issues, or abdominal pain.  She denies sore throat, cough, dyspnea, or chest pain.  REVIEW OF SYSTEMS:  Review of Systems  Constitutional: Negative.  Negative for appetite change, chills, fatigue, fever and unexpected weight change.  HENT:  Negative.   Eyes: Negative.    Respiratory: Negative.  Negative for chest tightness, cough, hemoptysis, shortness of breath and wheezing.   Cardiovascular: Negative.  Negative for chest pain, leg swelling and palpitations.  Gastrointestinal: Negative.  Negative for abdominal distention, abdominal pain, blood in stool, constipation, diarrhea, nausea and vomiting.  Endocrine: Negative.   Genitourinary: Negative.  Negative for difficulty urinating, dysuria, frequency and hematuria.   Musculoskeletal: Negative.  Negative for arthralgias, back pain, flank pain, gait problem and myalgias.  Skin: Negative.   Neurological: Negative.  Negative for dizziness, extremity weakness, gait problem, headaches, light-headedness, numbness, seizures and speech difficulty.  Hematological: Negative.   Psychiatric/Behavioral: Negative for depression and sleep disturbance. The patient is nervous/anxious.      VITALS:  Blood pressure (!) 173/96, pulse 64, temperature 98 F (36.7 C), temperature source Oral, resp. rate 18, height 5\' 5"  (1.651 m), weight 201 lb 11.2 oz (91.5 kg), SpO2 98 %.  Wt Readings from Last 3 Encounters:  05/12/20 201 lb 11.2 oz (91.5 kg)  04/19/20 197 lb 12.8 oz (89.7 kg)  06/09/19 195 lb (88.5 kg)    Body mass index is 33.56 kg/m.  Performance status (ECOG): 1 - Symptomatic but completely ambulatory  PHYSICAL EXAM:  Physical Exam Constitutional:      General: She is not in acute distress.    Appearance: Normal appearance. She is normal weight.  HENT:     Head: Normocephalic and atraumatic.  Eyes:     General: No scleral icterus.    Extraocular Movements: Extraocular movements intact.     Conjunctiva/sclera: Conjunctivae normal.     Pupils: Pupils are equal, round, and reactive to light.  Cardiovascular:     Rate and Rhythm: Normal rate and regular rhythm.     Pulses: Normal pulses.     Heart sounds: Normal heart sounds. No murmur heard. No friction rub. No gallop.   Pulmonary:     Effort: Pulmonary  effort is normal. No respiratory distress.     Breath sounds: Normal breath sounds.  Chest:  Breasts:     Left: Normal.      Comments: She has a firm mass like affect beneath the nipple areolar complex which is consistent with known seroma, and a tiny nodule at the biopsy site.  There is also mild firmness of the upper outer right breast.  She has another site in the right axilla, which feels normal. Abdominal:     General: Bowel sounds are normal. There is no distension.     Palpations: Abdomen is soft. There is no hepatomegaly, splenomegaly or mass.     Tenderness: There is no abdominal tenderness.  Musculoskeletal:  General: Normal range of motion.     Cervical back: Normal range of motion and neck supple.     Right lower leg: No edema.     Left lower leg: No edema.  Lymphadenopathy:     Cervical: No cervical adenopathy.  Skin:    General: Skin is warm and dry.  Neurological:     General: No focal deficit present.     Mental Status: She is alert and oriented to person, place, and time. Mental status is at baseline.  Psychiatric:        Mood and Affect: Mood normal.        Behavior: Behavior normal.        Thought Content: Thought content normal.        Judgment: Judgment normal.     LABS:   CBC Latest Ref Rng & Units 04/19/2020 12/03/2019  WBC - 4.4 3.8  Hemoglobin 12.0 - 16.0 13.2 13.3  Hematocrit 36 - 46 39 40  Platelets 150 - 399 187 196   CMP Latest Ref Rng & Units 04/19/2020 12/03/2019  BUN 4 - 21 13 13   Creatinine 0.5 - 1.1 1.0 1.0  Sodium 137 - 147 141 142  Potassium 3.4 - 5.3 3.9 3.8  Chloride 99 - 108 106 107  CO2 13 - 22 26(A) 25(A)  Calcium 8.7 - 10.7 9.2 9.4  Alkaline Phos 25 - 125 84 87  AST 13 - 35 39(A) 38(A)  ALT 7 - 35 35 35     No results found for: LDH   STUDIES:   EXAM: 04/29/2020 CT CHEST, ABDOMEN, AND PELVIS WITH CONTRAST  TECHNIQUE: Multidetector CT imaging of the chest, abdomen and pelvis was performed following the standard  protocol during bolus administration of intravenous contrast.  CONTRAST:  80 cc Isovue  COMPARISON:  CT 06/17/2017  FINDINGS: CT CHEST FINDINGS  Cardiovascular: No significant vascular findings. Normal heart size. No pericardial effusion.  Mediastinum/Nodes: No axillary or supraclavicular adenopathy. No mediastinal hilar adenopathy. No pericardial fluid. Esophagus normal.  Asymmetric thickening in the superficial/subareolar breast tissue (image 27/2) is similar to comparison CT 05/03/2018  Lungs/Pleura: RIGHT middle lobe nodule measuring 3 mm image 53/4 is unchanged. No suspicious pulmonary nodules.  Musculoskeletal: No aggressive osseous lesion.  CT ABDOMEN AND PELVIS FINDINGS  Hepatobiliary: No focal hepatic lesion. Postcholecystectomy. No biliary dilatation.  Pancreas: Pancreas is normal. No ductal dilatation. No pancreatic inflammation.  Spleen: Normal spleen  Adrenals/urinary tract: Adrenal glands and kidneys are normal. The ureters and bladder normal.  Stomach/Bowel: Stomach, small bowel, appendix, and cecum are normal. The colon and rectosigmoid colon are normal.  Vascular/Lymphatic: Abdominal aorta is normal caliber. There is no retroperitoneal or periportal lymphadenopathy. No pelvic lymphadenopathy.  Reproductive: Uterus and adnexa unremarkable.  Other: No peritoneal metastasis.  Musculoskeletal: No aggressive osseous lesion.  IMPRESSION: 1. No evidence of thoracic metastasis. 2. Stable asymmetric tissue within the RIGHT breast. Recommend correlation with recent diagnostic breast procedures. 3. No pulmonary metastasis. 4. No evidence of metastatic disease in the abdomen pelvis. 5. No skeletal metastasis   EXAM: 05/05/2020 DIGITAL DIAGNOSTIC BILATERAL MAMMOGRAM WITH TOMOSYNTHESIS AND CAD; ULTRASOUND RIGHT BREAST; ULTRASOUND LEFT BREAST  TECHNIQUE: Bilateral digital diagnostic mammography and breast tomosynthesis was performed. The images were  evaluated with computer-aided detection.; Targeted ultrasound examination of the right breast was performed.; Targeted ultrasound examination of the left breast was performed.  COMPARISON:  Previous exam(s).  ACR Breast Density Category c: The breast tissue is heterogeneously dense, which may obscure small  masses.  FINDINGS: The bilateral lumpectomy sites appear stable. No suspicious calcifications, masses or areas of distortion are seen in the bilateral breasts.  Physical exam of the surgical site in the retroareolar right breast demonstrates a superficial firm thickened area. In the left breast, just superior to the surgical scar in the upper inner quadrant, there is a firm palpable lump.  Ultrasound targeted to the retroareolar right breast extending to the slight lower inner quadrant, there is a hypoechoic area of shadowing consistent with the patient's lumpectomy site. However, just slightly away from the nipple at about 1-2 cm from the nipple, there are hypoechoic serpiginous structures, favored to represent ducts, that appear to be expanded with hypoechoic vascular tissue. Ultrasound of the right axilla demonstrates 1 thickened right axillary lymph node with cortex measuring 5 mm. Multiple other normal-appearing lymph nodes are identified.  Ultrasound targeted to the lumpectomy site in the upper inner left breast demonstrates expected surgical changes with a hypoechoic lumpectomy cavity extending to the scar on the skin surface.  IMPRESSION: 1. There is suspicious hypoechoic vascular material likely within ducts of the lower outer retroareolar right breast. This is just slightly inferior to the patient's surgical scar near her nipple.  2. There is 1 thickened right axillary lymph node with a cortex measuring 5 mm.  3. No abnormalities are found at the surgical site in the upper inner left breast.   EXAM: 05/10/2020 ULTRASOUND GUIDED RIGHT BREAST CORE NEEDLE  BIOPSIES OF THE 7 O'CLOCK SUBAREOLAR REGION OF THE RIGHT BREAST AND A RIGHT AXILLARY LYMPH NODE  PENDING   Allergies:  Allergies  Allergen Reactions  . Hydrocodone-Acetaminophen     Other reaction(s): Other (see comments)  . Other Itching    Skin redness.  . Rituxan [Rituximab]   . Tape Itching    Skin redness.  . Vicodin [Hydrocodone-Acetaminophen]     Current Medications: Current Outpatient Medications  Medication Sig Dispense Refill  . amLODipine (NORVASC) 2.5 MG tablet Take 2.5 mg by mouth daily.    Marland Kitchen CALCIUM-MAGNESIUM-ZINC PO Take 1 tablet by mouth daily.    . cholecalciferol (VITAMIN D) 1000 units tablet Take 6,000 Units by mouth daily.    . citalopram (CELEXA) 10 MG tablet Take 10 mg by mouth daily.    Marland Kitchen Dexlansoprazole (DEXILANT) 30 MG capsule Take 1 capsule (30 mg total) by mouth daily. 90 capsule 4  . diclofenac Sodium (VOLTAREN) 1 % GEL Voltaren 1 % topical gel  APPLY 4 GRAMS TO THE AFFECTED AREA(S) BY TOPICAL ROUTE 4 TIMES PER DAY    . ezetimibe (ZETIA) 10 MG tablet Take 10 mg by mouth daily.    . Iron-FA-B Cmp-C-Biot-Probiotic (FUSION PLUS) CAPS TAKE 1 CAPSULE BY MOUTH ONCE DAILY 30 capsule 3  . L-Lysine 1000 MG TABS Take 1,000 mg by mouth daily.    . rosuvastatin (CRESTOR) 5 MG tablet Take 5 mg by mouth daily.      No current facility-administered medications for this visit.     ASSESSMENT & PLAN:   Assessment:   1. History of low-grade cutaneous lymphoma diagnosed in 2004 and treated with surgery and chemotherapy many years ago.  2. History of IA hormone receptor positive left breast cancer in August 2015 treated with surgery and radiation.  She did not tolerate hormonal therapy.  She remains without evidence of recurrence.    3. 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    4. 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 This was treated with lumpectomy.  PET was otherwise negative, so no  further therapy was recommended.  5. Subareolar right breast mass, which is a seroma, confirmed by mammogram and ultrasound.   6. Mild elevation of the AST, which has nearly normalized, likely due to steatohepatitis.  7.  New suspicious hypoechoic vascular material likely within ducts of the lower outer retroareolar right breast, as well as 1 thickened right axillary lymph node with a cortex measuring 5 mm.  Biopsies are still pending, but this appears suspicious for recurrent lymphoma.  The special pathologist is still working with the specimen to confirm this.    Plan: She has new suspicious hypoechoic vascular material of the lower outer retroareolar right breast and one thickened right axillary lymph node measuring 5 mm.  These have been biopsied, and the official pathology report is still pending, but this seems to be consistent with recurrent lymphoma of the right breast.  We will obtain PET imaging to confirm there is no other evidence of distant disease.  If this is a solitary occurrence I would not recommend surgery, but radiation may be considered.  I will present her case to out tumor board for multidisciplinary discussion.  If she has widespread disease, I would approach this with systemic therapy.  The patient understands the plans discussed today and is in agreement with them.  She knows to contact our office if she develops concerns prior to her next appointment.   I provided 20 minutes of face-to-face time during this this encounter and > 50% was spent counseling as documented under my assessment and plan.    Derwood Kaplan, MD [facility] [address] [place] [address] Dept: [phone] Dept Fax: [phone]   I, Rita Ohara, am acting as scribe for Derwood Kaplan, MD  I have reviewed this report as typed by the medical scribe, and it is complete and accurate.  Hermina Barters

## 2020-05-13 ENCOUNTER — Telehealth: Payer: Self-pay

## 2020-05-13 NOTE — Telephone Encounter (Signed)
Patient notified

## 2020-05-13 NOTE — Telephone Encounter (Signed)
-----   Message from Derwood Kaplan, MD sent at 05/13/2020  2:38 PM EDT ----- Regarding: call pt Tell her the pathologist confirmed the breast bx is lymphoma, same type, but the lymph node was neg.  If the PET is otherwise neg., Dr. Orlene Erm agrees with radiation since lymphoma very sensitive

## 2020-05-14 ENCOUNTER — Telehealth: Payer: Self-pay | Admitting: Oncology

## 2020-05-14 NOTE — Telephone Encounter (Signed)
Patient called to let us know she will be in [place] in April 2022

## 2020-05-17 ENCOUNTER — Telehealth: Payer: Self-pay | Admitting: Oncology

## 2020-05-17 NOTE — Telephone Encounter (Signed)
05/17/20 Spoke with patient and scheduled PET SCAN

## 2020-05-17 NOTE — Telephone Encounter (Signed)
Per 3/21 los next appt scheduled and given to patient 

## 2020-05-19 ENCOUNTER — Other Ambulatory Visit: Payer: Self-pay

## 2020-05-19 ENCOUNTER — Ambulatory Visit (INDEPENDENT_AMBULATORY_CARE_PROVIDER_SITE_OTHER): Payer: BC Managed Care – PPO | Admitting: Gastroenterology

## 2020-05-19 ENCOUNTER — Encounter: Payer: Self-pay | Admitting: Gastroenterology

## 2020-05-19 VITALS — BP 136/84 | HR 63 | Ht 65.0 in | Wt 199.5 lb

## 2020-05-19 DIAGNOSIS — K219 Gastro-esophageal reflux disease without esophagitis: Secondary | ICD-10-CM | POA: Diagnosis not present

## 2020-05-19 DIAGNOSIS — K581 Irritable bowel syndrome with constipation: Secondary | ICD-10-CM

## 2020-05-19 MED ORDER — DEXLANSOPRAZOLE 30 MG PO CPDR: 30.0000 mg | DELAYED_RELEASE_CAPSULE | Freq: Every day | ORAL | 11 refills | Status: DC

## 2020-05-19 NOTE — Patient Instructions (Signed)
If you are age 56 or older, your body mass index should be between 23-30. Your Body mass index is 33.2 kg/m. If this is out of the aforementioned range listed, please consider follow up with your Primary Care Provider.  If you are age 63 or younger, your body mass index should be between 19-25. Your Body mass index is 33.2 kg/m. If this is out of the aformentioned range listed, please consider follow up with your Primary Care Provider.   We have sent the following medications to your pharmacy for you to pick up at your convenience: Dexilant  Thank you,  Dr. Jackquline Denmark

## 2020-05-19 NOTE — Progress Notes (Signed)
Chief Complaint: FU  Referring Provider:  Marco Collie, MD      ASSESSMENT AND PLAN;   #1. GERD with small HH  #2. IDA (resolved). H/O B12 deficiency. 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  Now with recurrence of right breast follicular lymphoma.  #4. 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 Neg CT AP 04/29/2020  Plan: - Continue Dexilant 30mg  po qd #90, 11 refills - Colace 1 tab po qd - Plenty of water  - PET at 6 AM on Monday.  - Hold off on any procedures until PET scan is completed.  If PET reveals any intra-abdominal pathology, then would proceed with further work-up.     HPI:    Kerri Jones is a 55 y.o. female  For FU visit.  Here for medication refill.  Seen by Dr. Hinton Rao. Had R breast palpable lesion.  This was followed by mammography and then biopsy revealing recurrence of lymphoma.  She is scheduled to have a PET scan on March 28th  Had CT chest Abdo/pelvis on 04/29/2020 which was unremarkable.  She also had negative EGD/colonoscopy November 2020.  She has some change in bowel habits in form of more constipation, LUQ abdominal discomfort.  She denies having any nausea, vomiting, heartburn, regurgitation, odynophagia or dysphagia.  She has somewhat more abdominal bloating.  She had one episode of rectal bleeding-away from the stool, attributed to hemorrhoids.  Most recent labs on 04/19/2020 showed hemoglobin to be normal at 13.2, AST 39, ALT 35.  She had normal creatinine at 1.0.    Past Medical History:  Diagnosis Date  . Breast cancer ([place]) 10/16/2013  . Cancer ([place]) 2004/2006/2020   Low B Cell lymphoma  . Chronic constipation   . Family history of colon cancer   . Family  history of prostate cancer   . GERD (gastroesophageal reflux disease)   . HTN (hypertension)   . Hypercholesteremia   . Personal history of radiation therapy   . Plantar fasciitis   . Vitamin B 12 deficiency     Past Surgical History:  Procedure Laterality Date  . APPENDECTOMY    . BREAST LUMPECTOMY Left 2015  . BREAST LUMPECTOMY Right 08/26/2018  . CESAREAN SECTION    . CHOLECYSTECTOMY    . COLONOSCOPY  08/08/2013   Colon polyp-status post polypectomy. Small internal hemorrhoids. Otherwise normal colonoscopy.   . ESOPHAGOGASTRODUODENOSCOPY  06/24/2012   Mild gastritis. Healed esophageal erosions. Otherwise normal EGD.   Marland Kitchen OOPHORECTOMY    . SKIN BIOPSY     multiple  . Throat Polyp removed    . TONSILLECTOMY      Family History  Problem Relation Age of Onset  . Colon polyps Mother   . Lung cancer Father 46  . Cancer Maternal Aunt 27       GIST  . Stomach cancer Maternal Aunt   . Colon cancer Maternal Uncle 51       dx twice at age 15  . Lung cancer Maternal Grandmother 67  . Prostate cancer Maternal Grandfather 62  . Esophageal cancer Neg Hx   . Rectal cancer Neg Hx     Social History   Tobacco Use  . Smoking status: Never Smoker  . Smokeless tobacco: Never  Used  Vaping Use  . Vaping Use: Never used  Substance Use Topics  . Alcohol use: No  . Drug use: Not Currently    Current Outpatient Medications  Medication Sig Dispense Refill  . amLODipine (NORVASC) 2.5 MG tablet Take 2.5 mg by mouth daily.    Marland Kitchen CALCIUM-MAGNESIUM-ZINC PO Take 1 tablet by mouth daily.    . cholecalciferol (VITAMIN D) 1000 units tablet Take 6,000 Units by mouth daily.    . citalopram (CELEXA) 10 MG tablet Take 10 mg by mouth daily.    Marland Kitchen Dexlansoprazole (DEXILANT) 30 MG capsule Take 1 capsule (30 mg total) by mouth daily. 90 capsule 4  . ezetimibe (ZETIA) 10 MG tablet Take 10 mg by mouth daily.    . Iron-FA-B Cmp-C-Biot-Probiotic (FUSION PLUS) CAPS TAKE 1 CAPSULE BY MOUTH ONCE DAILY 30  capsule 3  . L-Lysine 1000 MG TABS Take 1,000 mg by mouth daily.    . rosuvastatin (CRESTOR) 5 MG tablet Take 5 mg by mouth daily.      No current facility-administered medications for this visit.    Allergies  Allergen Reactions  . Hydrocodone-Acetaminophen     Other reaction(s): Other (see comments)  . Other Itching    Skin redness.  . Rituxan [Rituximab]   . Tape Itching    Skin redness.  . Vicodin [Hydrocodone-Acetaminophen]     Review of Systems:  neg     Physical Exam:    BP 136/84   Pulse 63   Ht 5\' 5"  (1.651 m)   Wt 199 lb 8 oz (90.5 kg)   BMI 33.20 kg/m  Filed Weights   05/19/20 1429  Weight: 199 lb 8 oz (90.5 kg)   Constitutional:  Well-developed, in no acute distress. Psychiatric: Normal mood and affect. Behavior is normal. Pulmonary/chest: Effort normal and breath sounds normal. No wheezing, rales or rhonchi. Abdominal: Soft, nondistended. Nontender. Bowel sounds active throughout. There are no masses palpable. No hepatomegaly.  Reviewed Dr. Remi Deter message, last note and labs.    Carmell Austria, MD 05/19/2020, 2:45 PM  Cc: Marco Collie, MD  Dr Hosie Poisson.

## 2020-05-21 ENCOUNTER — Encounter: Payer: Self-pay | Admitting: Oncology

## 2020-05-25 NOTE — Progress Notes (Signed)
[facility]  [address] [place],  [place]  [address] [phone]  Clinic Day:  05/27/2020  Referring physician: Marco Collie, MD  This document serves as a record of services personally performed by Hosie Poisson, MD. It was created on their behalf by Curry,Lauren E, a trained medical scribe. The creation of this record is based on the scribe's personal observations and the provider's statements to them.  CHIEF COMPLAINT:  CC: History of low-grade cutaneous B-cell lymphoma  Current Treatment:  Surveillance   HISTORY OF PRESENT ILLNESS:  Kerri Jones is a 55 y.o. female with a remote history of a low-grade cutaneous B-cell lymphoma, which was in remission for 10 years.  This was originally found in a skin lesion in 2004, biopsy of which was felt to represent lymphoproliferative tissue, but not definitively lymphoma.  A recurrent skin lesion of her back in 2006 was excised and pathology revealed a low-grade B-cell lymphoma.  She did not have evidence of disease elsewhere.  She received chemotherapy with CVP/rituximab.  She had an allergic reaction to rituximab with her first dose, so this was discontinued.  She had an excellent response to CVP alone.  This was completed in June 2006.    She was found to have stage IA (T1c N0 M0) hormone receptor positive left breast cancer in August 2015.  She was treated with lumpectomy.  Pathology revealed a 1.1 cm, grade 2, invasive ductal carcinoma with 3 negative nodes and a close deep margin.  Estrogen and progesterone receptors were positive and her 2 Neu negative.  Ki 67 was 28%.  Oncotype DX testing revealed a recurrence score of 17, which is in the low risk category, so chemotherapy was not recommended.  She received adjuvant radiation to the left breast, which was completed in November 2015. She was placed on hormonal therapy with tamoxifen in December, as she was still premenopausal.  Within a few  months, she had severe toxicities of fatigue, lightheadedness, dyspnea, and paresthesias of her hands, so tamoxifen was discontinued.  The symptoms abated when she stopped the medication.  Rather than undergo Lupron, the patient underwent a bilateral salpingo-oophorectomy in June 2016.  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  Staging scans did not reveal any other areas of disease, so we opted for observation only.  We did eventually try her on an aromatase inhibitor for her breast cancer, but she did not tolerate this either.  She previously had B12 deficiency treated with B12 injections, but these were discontinued and she does not take oral B12 supplement.  She has not had evidence of recurrent B12 deficiency.  She has also had abnormal liver transaminases and intermittent leukopenia.  Probable hepatic steatosis was noted on CT abdomen and pelvis, as well as abdominal ultrasound.  She had previous iron deficiency, which resolved.    Due to her personal and family history of cancer she did undergo testing for hereditary cancer syndromes with the [employer] Cancer Next gene panel.  This did not reveal any clinically significant mutation or variants of uncertain significance.    Bilateral mammogram in August 2017 did not reveal any evidence of malignancy.  Ultrasound of the left breast was done due to firmness of the lumpectomy site and revealed a 3.9 cm oval mass which was circumscribed with layering debris consistent with a complex seroma.  This was stable in size and felt to be benign.  It was recommended that she have a repeat ultrasound in 6 months.  This was done March 2018 and revealed a decrease in the seroma .  The patient was seen in March 2018 because she noticed 2 red spots on the anterior chest, which appeared very similar to her prior cutaneous lymphoma.  She was referred for biopsy and the mid chest shave biopsy was a lichenoid keratosis and the skin of  her right upper arm was a subcutaneous area of granulomatous and mixed inflammation.  In March 2020 CTA chest did not reveal any evidence of pulmonary embolism or other acute abnormality.  Echocardiogram revealed normal left ventricular size and function with an ejection fraction of 60-65%.  She was added to the schedule on May 26th, as she phoned reporting a new right breast mass.  Right diagnostic mammogram and ultrasound were performed  The mammogram was negative, but the ultrasound revealed a 3 cm in diameter hypoechoic mass at 9 o'clock 1 cm from the nipple.  Biopsy was consistent with a lymphoproliferative disorder.  After reviewing the report of the stains and description, we felt this represented recurrence of her lymphoma.  PET scan revealed hypermetabolic activity in the right breast with an SUV of 5.4, but no other areas of hyper metabolism.  She was treated with right lumpectomy in June.  There was a new skin lesion of the upper chest and Dr. Amalia Hailey biopsied this at the time of her right breast lumpectomy.  The skin lesion was a benign keratosis.  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  Margins were clear.  As this was completely excised, no further therapy for the lymphoma was recommended.  She had COVID-19 in July 2020, but had very minimal symptoms.    In early February 2021 she had a palpable mass in the right breast under the nipple, which was mildly tender.  Diagnostic bilateral mammogram and bilateral breast ultrasound from February 17th revealed a 2.8 cm retroareolar postoperative seroma containing a small amount of entrapped fat with overlying postsurgical scar tissue.  Normal appearing breast tissue throughout the upper outer left breast including dense glandular tissue  with a rounded peak, corresponding to the area of palpable nodularity and tenderness.  There was a no evidence of malignancy.  She is up-to-date on pelvic examination, as well as  colonoscopy.  Colonoscopy, as well as EGD were done in November 2020 by Dr. Lyndel Safe.  When she was seen in April of 2021, she had mild anemia with a low normal ferritin.  We recommended increasing iron in her diet and she was on an iron supplement for a time.  In February 2022 she was seen for abdominal complaints but her CT scan of chest, abdomen and pelvis was negative.  I have recommended that she see Dr.Gupta for follow up.  Bilateral diagnostic mammogram and ultrasound from March 9th revealed suspicious hypoechoic vascular material likely within ducts of the lower outer retroareolar right breast, as well as 1 thickened right axillary lymph node with a cortex measuring 5 mm.  Ultrasound guided biopsies of the right breast and right axillary node were obtained on March 14th and pathology is consistent with low grade lymphoma of the breast and the histology is quite similar to her previous lymphomas, but the lymph node biopsy is negative.    INTERVAL HISTORY:  Aidynn is here for follow up and to review recent imaging results.  PET imaging from March 28th revealed intense activity associated with  right breast mass consistent lymphoma recurrence.  No evidence of hypermetabolic metastatic lymphadenopathy.  The moderate activity within the left humeral head is favored posttraumatic or degenerative.  We will obtain left shoulder x-ray today for further evaluation.  She states that she is doing well and denies complaints.  Her  appetite is good, and she has lost 1 and 1/2 pounds since her last visit.  She denies fever, chills or other signs of infection.  She denies nausea, vomiting, bowel issues, or abdominal pain.  She denies sore throat, cough, dyspnea, or chest pain.  REVIEW OF SYSTEMS:  Review of Systems  Constitutional: Negative.  Negative for appetite change, chills, fatigue, fever and unexpected weight change.  HENT:  Negative.   Eyes: Negative.   Respiratory: Negative.  Negative for chest tightness,  cough, hemoptysis, shortness of breath and wheezing.   Cardiovascular: Negative.  Negative for chest pain, leg swelling and palpitations.  Gastrointestinal: Negative.  Negative for abdominal distention, abdominal pain, blood in stool, constipation, diarrhea, nausea and vomiting.  Endocrine: Negative.   Genitourinary: Negative.  Negative for difficulty urinating, dysuria, frequency and hematuria.   Musculoskeletal: Negative.  Negative for arthralgias, back pain, flank pain, gait problem and myalgias.  Skin: Negative.   Neurological: Negative.  Negative for dizziness, extremity weakness, gait problem, headaches, light-headedness, numbness, seizures and speech difficulty.  Hematological: Negative.   Psychiatric/Behavioral: Negative.  Negative for depression and sleep disturbance. The patient is not nervous/anxious.      VITALS:  Blood pressure 136/86, pulse 88, temperature 97.9 F (36.6 C), temperature source Oral, resp. rate 18, height 5\' 5"  (1.651 m), weight 198 lb (89.8 kg), SpO2 96 %.  Wt Readings from Last 3 Encounters:  05/27/20 198 lb (89.8 kg)  05/19/20 199 lb 8 oz (90.5 kg)  05/12/20 201 lb 11.2 oz (91.5 kg)    Body mass index is 32.95 kg/m.  Performance status (ECOG): 0 - Asymptomatic  PHYSICAL EXAM:  Physical Exam Constitutional:      General: She is not in acute distress.    Appearance: Normal appearance. She is normal weight.  HENT:     Head: Normocephalic and atraumatic.  Eyes:     General: No scleral icterus.    Extraocular Movements: Extraocular movements intact.     Conjunctiva/sclera: Conjunctivae normal.     Pupils: Pupils are equal, round, and reactive to light.  Cardiovascular:     Rate and Rhythm: Normal rate and regular rhythm.     Pulses: Normal pulses.     Heart sounds: Normal heart sounds. No murmur heard. No friction rub. No gallop.   Pulmonary:     Effort: Pulmonary effort is normal. No respiratory distress.     Breath sounds: Normal breath sounds.   Chest:     Comments: She has some firmness at 4 o'clock in the right breast which may be post biopsy changes.  She also has some firmness at 9 o'clock at the nipple areolar complex which is a stable seroma.  There is some mild firmness in the upper right breast which is nonspecific.  She has a scar in the upper inner quadrant of the left breast which is stable and has some firmness above it. Abdominal:     General: Bowel sounds are normal. There is no distension.     Palpations: Abdomen is soft. There is no hepatomegaly, splenomegaly or mass.     Tenderness: There is no abdominal tenderness.  Musculoskeletal:        General: Normal  range of motion.     Cervical back: Normal range of motion and neck supple.     Right lower leg: No edema.     Left lower leg: No edema.  Lymphadenopathy:     Cervical: No cervical adenopathy.  Skin:    General: Skin is warm and dry.  Neurological:     General: No focal deficit present.     Mental Status: She is alert and oriented to person, place, and time. Mental status is at baseline.  Psychiatric:        Mood and Affect: Mood normal.        Behavior: Behavior normal.        Thought Content: Thought content normal.        Judgment: Judgment normal.     LABS:   CBC Latest Ref Rng & Units 04/19/2020 12/03/2019  WBC - 4.4 3.8  Hemoglobin 12.0 - 16.0 13.2 13.3  Hematocrit 36 - 46 39 40  Platelets 150 - 399 187 196   CMP Latest Ref Rng & Units 04/19/2020 12/03/2019  BUN 4 - 21 13 13   Creatinine 0.5 - 1.1 1.0 1.0  Sodium 137 - 147 141 142  Potassium 3.4 - 5.3 3.9 3.8  Chloride 99 - 108 106 107  CO2 13 - 22 26(A) 25(A)  Calcium 8.7 - 10.7 9.2 9.4  Alkaline Phos 25 - 125 84 87  AST 13 - 35 39(A) 38(A)  ALT 7 - 35 35 35     No results found for: LDH   STUDIES:   EXAM: 05/10/2020 ULTRASOUND GUIDED RIGHT BREAST CORE NEEDLE BIOPSIES OF THE 7 O'CLOCK SUBAREOLAR REGION OF THE RIGHT BREAST AND A RIGHT AXILLARY LYMPH NODE 1.  Breast, right, needle  core biopsy, 7 o'clock:  B-cell lymphoproliferative disorder 2.  Lymph node, needle/core biopsy, right axilla:  Lymph node with reactive features 3.  Lymph node, needle/core biopsy, right axilla:  Lymph node with reactive features    EXAM: 05/24/2020 NUCLEAR MEDICINE PET SKULL BASE TO THIGH  TECHNIQUE: 13.2 mCi F-18 FDG was injected intravenously. Full-ring PET imaging was performed from the skull base to thigh after the radiotracer. CT data was obtained and used for attenuation correction and anatomic localization.  Fasting blood glucose: 95 mg/dl  COMPARISON:  CT 04/29/2020  FINDINGS: Mediastinal blood pool activity: SUV max 2.9  Liver activity: SUV max NA  NECK: No hypermetabolic lymph nodes in the neck.  Incidental CT findings: none  CHEST: Intense metabolic activity associated with the thickened tissue within the RIGHT breast which extends to the skin surface. Activity is intense with SUV max equal 7.9 (image 115). No hypermetabolic RIGHT axillary lymph nodes. Low activity associated with a normal morphology RIGHT axillary lymph node.  No hypermetabolic mediastinal lymph nodes. No suspicious pulmonary nodules.  Incidental CT findings: none  ABDOMEN/PELVIS: No abnormal hypermetabolic activity within the liver, pancreas, adrenal glands, or spleen. No hypermetabolic lymph nodes in the abdomen or pelvis.  Incidental CT findings: Uterus and ovaries normal.  SKELETON: Focal activity within the LEFT humeral head with SUV max equal 3.8. No clear lesion on the CT portion exam. (Image 59).  Incidental CT findings: none  IMPRESSION: 1. Intense activity associated with RIGHT breast mass consistent lymphoma recurrence. 2. No evidence of hypermetabolic metastatic lymphadenopathy. 3. Normal spleen and bone marrow. 4. Moderate activity within the LEFT humeral head is favored posttraumatic or degenerative.    Allergies:  Allergies  Allergen Reactions  .  Hydrocodone-Acetaminophen  Other reaction(s): Other (see comments)  . Other Itching    Skin redness.  . Rituxan [Rituximab]   . Tape Itching    Skin redness.  . Vicodin [Hydrocodone-Acetaminophen]     Current Medications: Current Outpatient Medications  Medication Sig Dispense Refill  . amLODipine (NORVASC) 2.5 MG tablet Take 2.5 mg by mouth daily.    Marland Kitchen CALCIUM-MAGNESIUM-ZINC PO Take 1 tablet by mouth daily.    . cholecalciferol (VITAMIN D) 1000 units tablet Take 6,000 Units by mouth daily.    . citalopram (CELEXA) 10 MG tablet Take 10 mg by mouth daily.    Marland Kitchen Dexlansoprazole (DEXILANT) 30 MG capsule Take 1 capsule (30 mg total) by mouth daily. 30 capsule 11  . ezetimibe (ZETIA) 10 MG tablet Take 10 mg by mouth daily.    . Iron-FA-B Cmp-C-Biot-Probiotic (FUSION PLUS) CAPS TAKE 1 CAPSULE BY MOUTH ONCE DAILY 30 capsule 3  . L-Lysine 1000 MG TABS Take 1,000 mg by mouth daily.    . rosuvastatin (CRESTOR) 5 MG tablet Take 5 mg by mouth daily.      No current facility-administered medications for this visit.     ASSESSMENT & PLAN:   Assessment:   1. History of low-grade cutaneous lymphoma diagnosed in 2004 and treated with surgery and chemotherapy many years ago.  2. History of IA hormone receptor positive left breast cancer in August 2015 treated with surgery and radiation.  She did not tolerate hormonal therapy.  She remains without evidence of recurrence.    3. 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    4. 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 This was treated with lumpectomy.  PET was otherwise negative, so no further therapy was recommended.  5. Subareolar right breast mass, which is a seroma, confirmed by mammogram and ultrasound.   6.  Recurrent lymphoma of the lower outer retroareolar right breast in March 2022.  There was 1 thickened right axillary lymph node but this was not hypermetabolic on PET  imaging and biopsy was negative.    Plan: PET imaging was negative for evidence of distant disease.  As this is a solitary occurrence I would not recommend surgery or chemotherapy, but radiation may be considered.  She will meet with Dr. Orlene Erm following our appointment to discuss this further.  As there was mild FDG uptake of the left humeral head, we will obtain x-ray imaging for further evaluation.  However, I am not highly suspicious and this was favored to be post traumatic or degenerative in nature.  We will plan to see her back in August with CBC, CMP and LDH for repeat evaluation.  The patient understands the plans discussed today and is in agreement with them.  She knows to contact our office if she develops concerns prior to her next appointment.   I provided 45 minutes of face-to-face time during this this encounter and > 50% was spent counseling as documented under my assessment and plan.    Derwood Kaplan, MD [facility] [address] [place] [address] Dept: [phone] Dept Fax: [phone]   I, Rita Ohara, am acting as scribe for Derwood Kaplan, MD  I have reviewed this report as typed by the medical scribe, and it is complete and accurate.  Hermina Barters

## 2020-05-27 ENCOUNTER — Other Ambulatory Visit: Payer: Self-pay | Admitting: Oncology

## 2020-05-27 ENCOUNTER — Other Ambulatory Visit: Payer: Self-pay

## 2020-05-27 ENCOUNTER — Encounter: Payer: Self-pay | Admitting: Oncology

## 2020-05-27 ENCOUNTER — Inpatient Hospital Stay: Payer: BC Managed Care – PPO | Admitting: Oncology

## 2020-05-27 VITALS — BP 136/86 | HR 88 | Temp 97.9°F | Resp 18 | Ht 65.0 in | Wt 198.0 lb

## 2020-05-27 DIAGNOSIS — C8599 Non-Hodgkin lymphoma, unspecified, extranodal and solid organ sites: Secondary | ICD-10-CM

## 2020-05-27 DIAGNOSIS — C8219 Follicular lymphoma grade II, extranodal and solid organ sites: Secondary | ICD-10-CM

## 2020-05-27 DIAGNOSIS — C8590 Non-Hodgkin lymphoma, unspecified, unspecified site: Secondary | ICD-10-CM

## 2020-05-31 ENCOUNTER — Telehealth: Payer: Self-pay

## 2020-05-31 NOTE — Telephone Encounter (Signed)
-----   Message from Derwood Kaplan, MD sent at 05/28/2020  9:15 AM EDT ----- Regarding: call pt Tell her xray of humerus is completely clear

## 2020-05-31 NOTE — Telephone Encounter (Signed)
Patient notified

## 2020-06-02 ENCOUNTER — Ambulatory Visit: Payer: BC Managed Care – PPO | Admitting: Oncology

## 2020-06-02 ENCOUNTER — Other Ambulatory Visit: Payer: BC Managed Care – PPO

## 2020-07-19 IMAGING — [facility] DIGITAL DIAGNOSTIC UNILATERAL RIGHT MAMMOGRAM WITH TOMO AND CAD
6 series · 6 of 18 positions shown · non-contrast
Comparison: Previous exam(s).

CLINICAL DATA: New palpable lump in the right breast. History of
left breast cancer.

EXAM:
DIGITAL DIAGNOSTIC RIGHT MAMMOGRAM WITH CAD AND TOMO
ULTRASOUND RIGHT BREAST

[R TAN synth-2D]
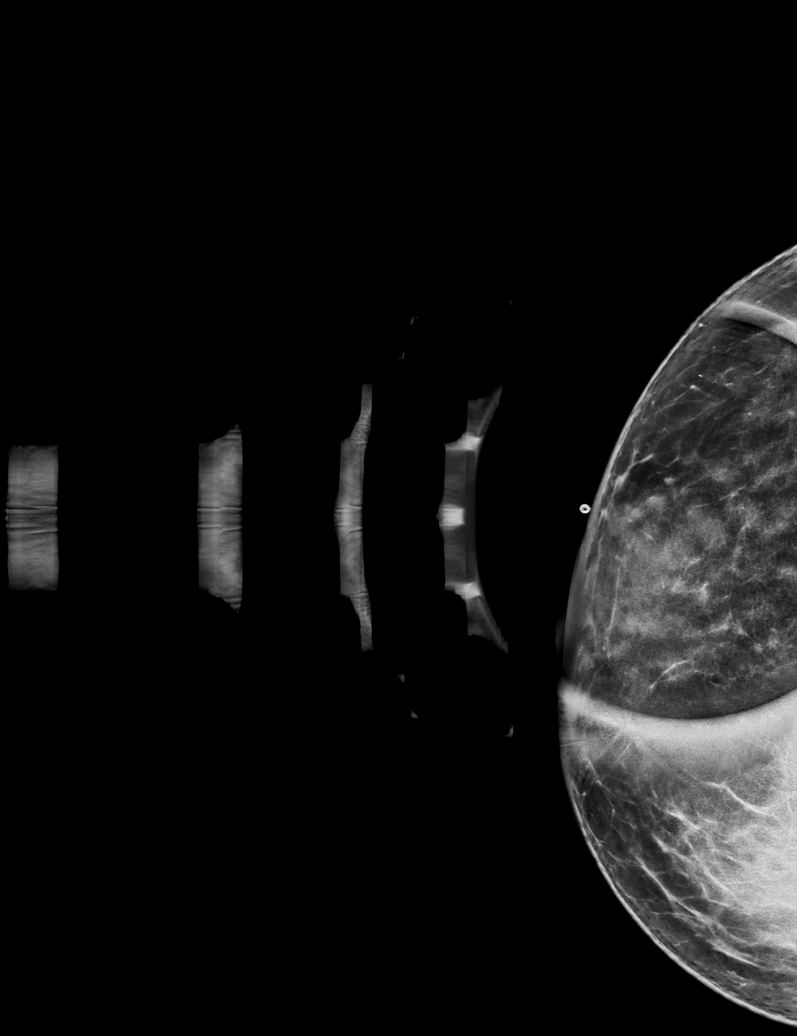

[R CC synth-2D]
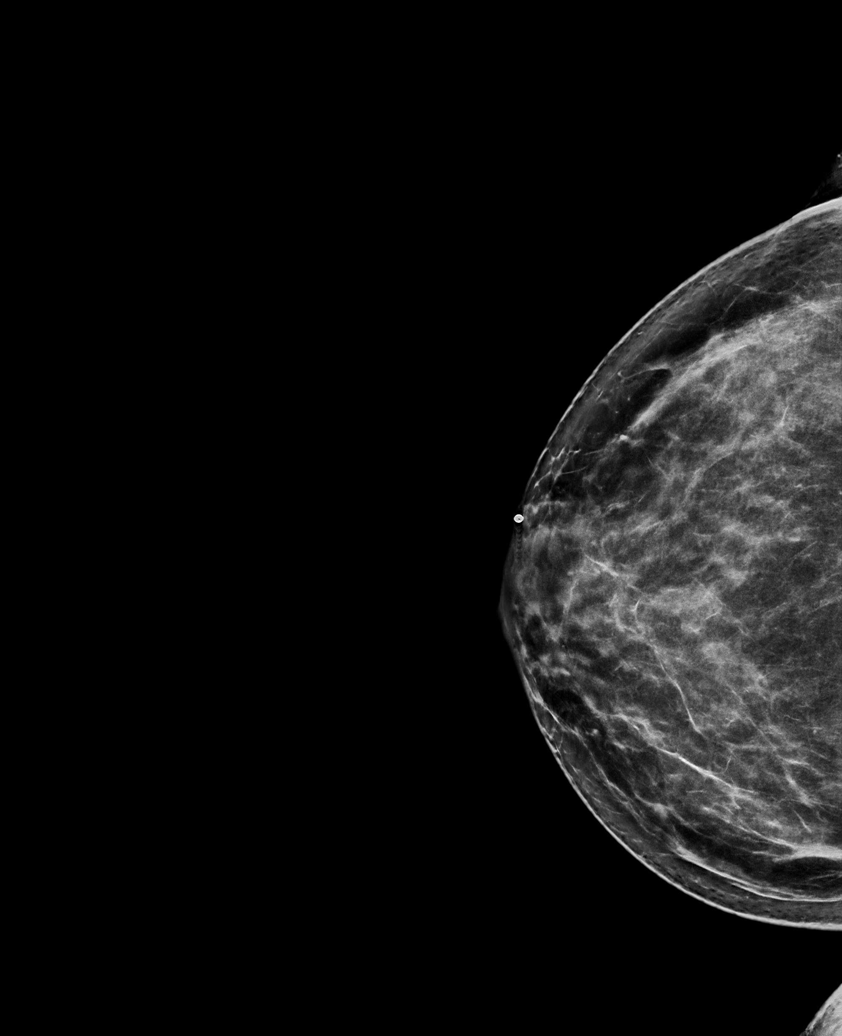

[R MLO synth-2D]
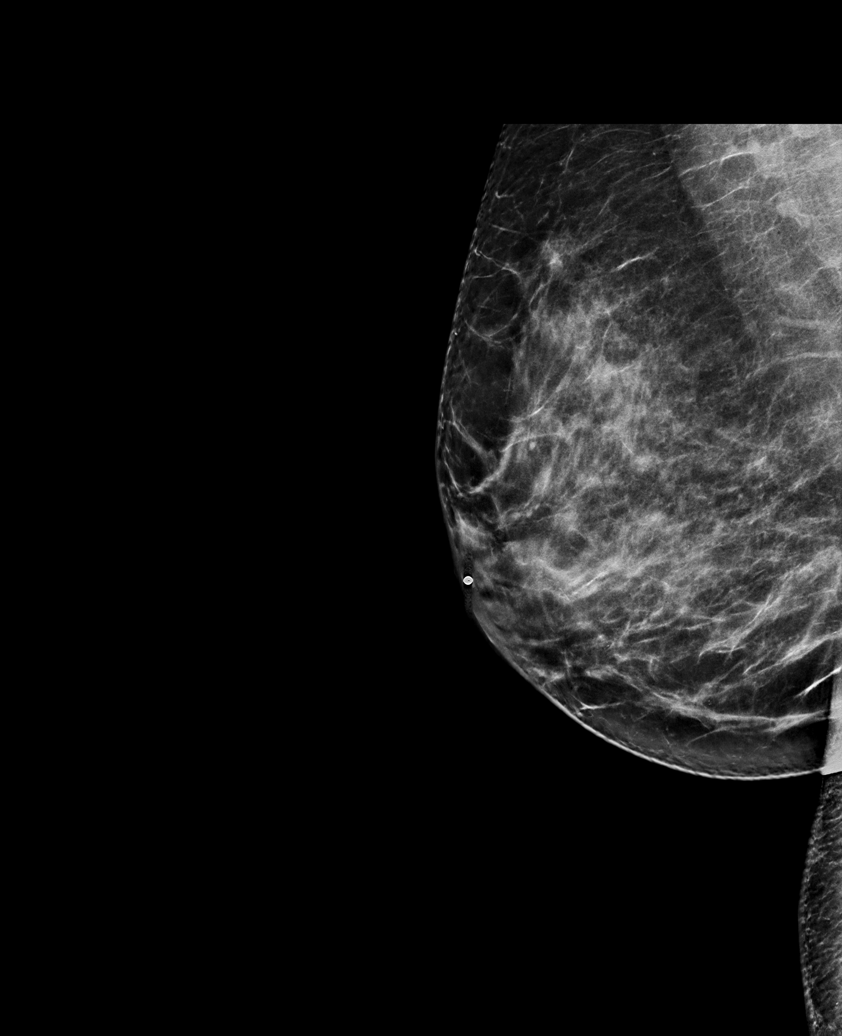

[R TAN tomo · tomo slice 29/57.0]
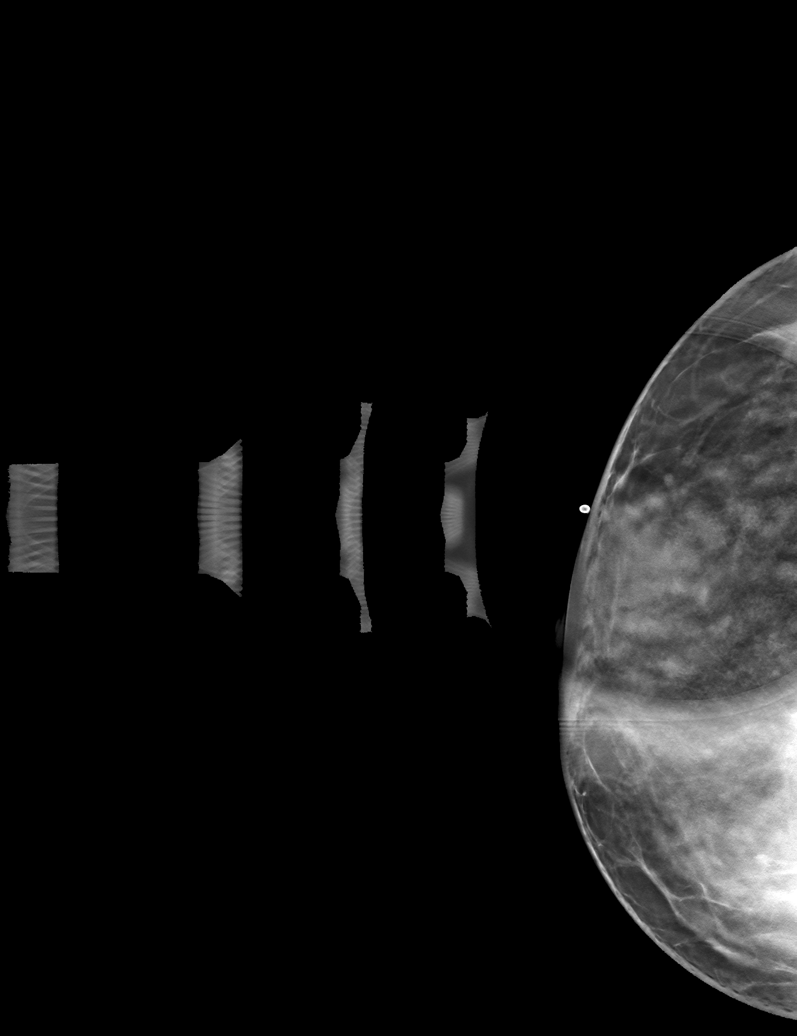

[R CC tomo · tomo slice 42/83.0]
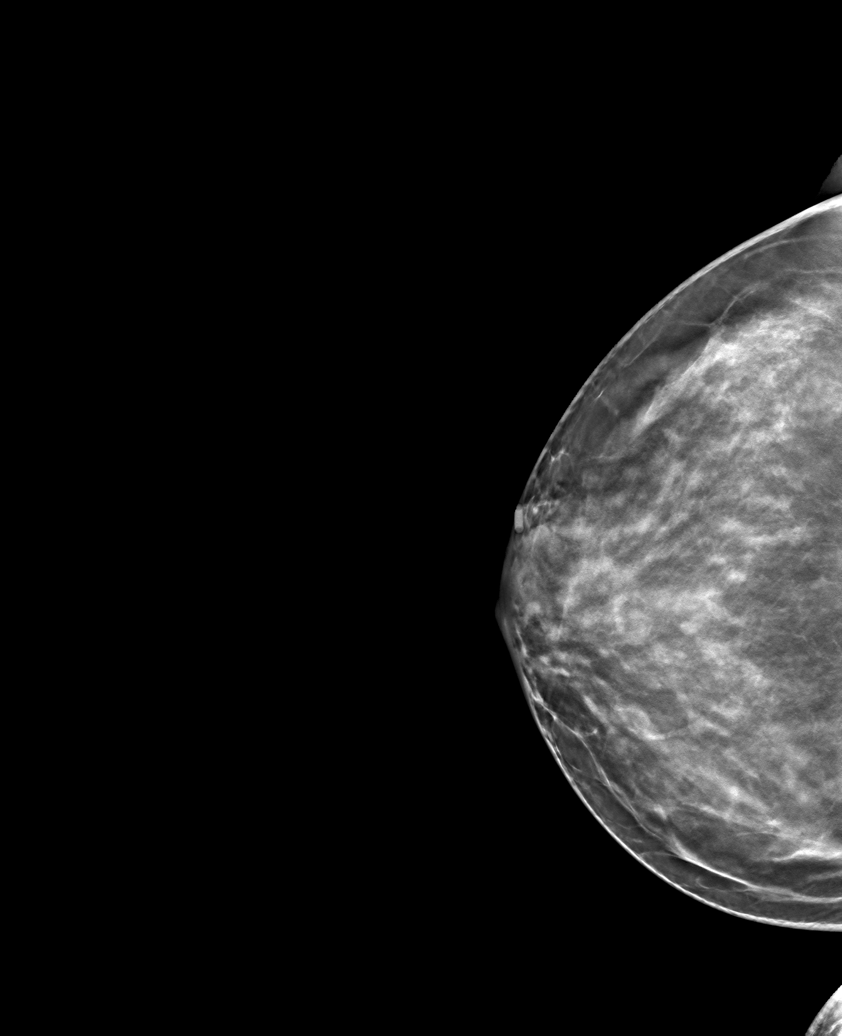

[R MLO tomo · tomo slice 41/82.0]
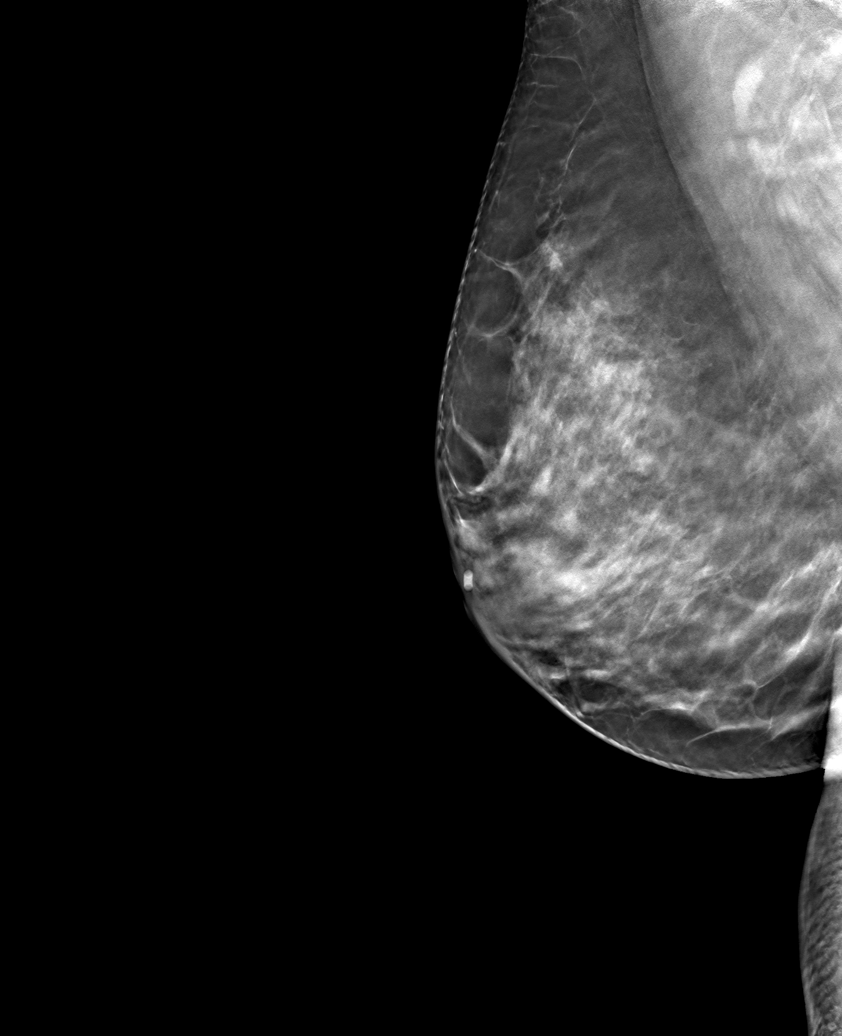

[6 of 18 positions shown; findings below may reference images not displayed]

ACR Breast Density Category c: The breast tissue is heterogeneously
dense, which may obscure small masses.
FINDINGS: No suspicious masses, calcifications, or distortion identified
mammographically in the right breast.

Mammographic images were processed with CAD.

On physical exam, no suspicious lumps are identified.

Targeted ultrasound is performed, showing a hypoechoic mass with
increased blood flow in the right breast at 9 o'clock, 1 cm from the
nipple correlating with the patient's symptoms. The mass measures 3
x 1.5 x 2.5 cm. No axillary adenopathy.
IMPRESSION: Indeterminate newly palpable right breast mass.

RECOMMENDATION:
Recommend ultrasound-guided biopsy of the new right breast mass.

I have discussed the findings and recommendations with the patient.
Results were also provided in writing at the conclusion of the
visit. If applicable, a reminder letter will be sent to the patient
regarding the next appointment.

BI-RADS CATEGORY  4: Suspicious.

## 2020-07-20 ENCOUNTER — Telehealth: Payer: Self-pay | Admitting: Gastroenterology

## 2020-07-20 MED ORDER — DEXLANSOPRAZOLE 30 MG PO CPDR: 30.0000 mg | DELAYED_RELEASE_CAPSULE | Freq: Every day | ORAL | 11 refills | Status: DC

## 2020-07-20 NOTE — Telephone Encounter (Signed)
She said that her insurance will cover the generic for the PPI so generic dexilant, Prilosec, Protonix, Nexium and prevacid they will cover but not brands. Told her I would choose the generic dexilant and send that in and if it doesn't work after July 1st we will see about an alternative.   Generic dexilant sent in

## 2020-08-26 NOTE — Telephone Encounter (Signed)
LVM  Please advise

## 2020-08-26 NOTE — Telephone Encounter (Signed)
Inbound call form patient stating insurance will no longer cover generic Dexilant but will only cover lansoprazole, esomeprazole, pantoprazole or omeprazole.  Please advise.

## 2020-08-27 NOTE — Telephone Encounter (Signed)
Having similar problems with other patients as well Change Dexilant to Protonix 40 mg p.o. once a day for now, #30, 11 refills RG

## 2020-08-31 MED ORDER — PANTOPRAZOLE SODIUM 40 MG PO TBEC: 40.0000 mg | DELAYED_RELEASE_TABLET | Freq: Every day | ORAL | 11 refills | Status: DC

## 2020-08-31 NOTE — Addendum Note (Signed)
Addended by: Curlene Labrum E on: 08/31/2020 09:24 AM   Modules accepted: Orders

## 2020-08-31 NOTE — Telephone Encounter (Signed)
Medication sent and patient is aware. Told her to call in 2-3 weeks to let us know how she is doing on the medication

## 2020-09-27 ENCOUNTER — Telehealth: Payer: Self-pay | Admitting: Hematology and Oncology

## 2020-09-27 ENCOUNTER — Other Ambulatory Visit: Payer: Self-pay | Admitting: Hematology and Oncology

## 2020-09-27 ENCOUNTER — Other Ambulatory Visit: Payer: Self-pay

## 2020-09-27 ENCOUNTER — Inpatient Hospital Stay: Payer: BC Managed Care – PPO

## 2020-09-27 ENCOUNTER — Inpatient Hospital Stay: Payer: BC Managed Care – PPO | Attending: Oncology | Admitting: Hematology and Oncology

## 2020-09-27 ENCOUNTER — Encounter: Payer: Self-pay | Admitting: Hematology and Oncology

## 2020-09-27 VITALS — BP 137/85 | HR 67 | Temp 98.3°F | Resp 16 | Ht 65.0 in | Wt 202.7 lb

## 2020-09-27 DIAGNOSIS — Z800 Family history of malignant neoplasm of digestive organs: Secondary | ICD-10-CM | POA: Insufficient documentation

## 2020-09-27 DIAGNOSIS — Z8719 Personal history of other diseases of the digestive system: Secondary | ICD-10-CM | POA: Diagnosis not present

## 2020-09-27 DIAGNOSIS — N6321 Unspecified lump in the left breast, upper outer quadrant: Secondary | ICD-10-CM | POA: Diagnosis not present

## 2020-09-27 DIAGNOSIS — Z90721 Acquired absence of ovaries, unilateral: Secondary | ICD-10-CM | POA: Diagnosis not present

## 2020-09-27 DIAGNOSIS — C8219 Follicular lymphoma grade II, extranodal and solid organ sites: Secondary | ICD-10-CM | POA: Insufficient documentation

## 2020-09-27 DIAGNOSIS — Z79899 Other long term (current) drug therapy: Secondary | ICD-10-CM | POA: Insufficient documentation

## 2020-09-27 DIAGNOSIS — Z8042 Family history of malignant neoplasm of prostate: Secondary | ICD-10-CM | POA: Insufficient documentation

## 2020-09-27 DIAGNOSIS — D649 Anemia, unspecified: Secondary | ICD-10-CM | POA: Insufficient documentation

## 2020-09-27 DIAGNOSIS — Z801 Family history of malignant neoplasm of trachea, bronchus and lung: Secondary | ICD-10-CM | POA: Diagnosis not present

## 2020-09-27 DIAGNOSIS — Z9049 Acquired absence of other specified parts of digestive tract: Secondary | ICD-10-CM | POA: Insufficient documentation

## 2020-09-27 DIAGNOSIS — Z8572 Personal history of non-Hodgkin lymphomas: Secondary | ICD-10-CM | POA: Diagnosis not present

## 2020-09-27 DIAGNOSIS — Z923 Personal history of irradiation: Secondary | ICD-10-CM | POA: Diagnosis not present

## 2020-09-27 DIAGNOSIS — Z885 Allergy status to narcotic agent status: Secondary | ICD-10-CM | POA: Diagnosis not present

## 2020-09-27 DIAGNOSIS — N6315 Unspecified lump in the right breast, overlapping quadrants: Secondary | ICD-10-CM | POA: Diagnosis not present

## 2020-09-27 DIAGNOSIS — Z888 Allergy status to other drugs, medicaments and biological substances status: Secondary | ICD-10-CM | POA: Diagnosis not present

## 2020-09-27 LAB — BASIC METABOLIC PANEL
BUN: 15 (ref 4–21)
CO2: 22 (ref 13–22)
Chloride: 107 (ref 99–108)
Creatinine: 1 (ref 0.5–1.1)
Glucose: 119
Potassium: 3.8 (ref 3.4–5.3)
Sodium: 141 (ref 137–147)

## 2020-09-27 LAB — COMPREHENSIVE METABOLIC PANEL
Albumin: 4.4 (ref 3.5–5.0)
Calcium: 9 (ref 8.7–10.7)

## 2020-09-27 LAB — CBC AND DIFFERENTIAL
HCT: 38 (ref 36–46)
Hemoglobin: 13 (ref 12.0–16.0)
Neutrophils Absolute: 2.51
Platelets: 175 (ref 150–399)
WBC: 3.8

## 2020-09-27 LAB — LACTATE DEHYDROGENASE: LDH: 174 U/L (ref 98–192)

## 2020-09-27 LAB — HEPATIC FUNCTION PANEL
ALT: 37 — AB (ref 7–35)
AST: 39 — AB (ref 13–35)
Alkaline Phosphatase: 83 (ref 25–125)
Bilirubin, Total: 0.7

## 2020-09-27 LAB — CBC: RBC: 4.64 (ref 3.87–5.11)

## 2020-09-27 NOTE — Telephone Encounter (Signed)
Per 8/1 LOS, patient scheduled for Dec Appt's.  Gave patient Appt Summary

## 2020-09-28 ENCOUNTER — Encounter: Payer: Self-pay | Admitting: Hematology and Oncology

## 2020-09-28 NOTE — Progress Notes (Signed)
[facility]  [address] 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 female with a history of remote history of a low-grade cutaneous B-cell lymphoma, which was in remission for 10 years.  This was originally found in a skin lesion in 2004, biopsy of which was felt to represent lymphoproliferative tissue, but not definitively lymphoma.  A recurrent skin lesion of her back in 2006 was excised and pathology revealed a low-grade B-cell lymphoma.  She did not have evidence of disease elsewhere.  She received chemotherapy with CVP/rituximab.  She had an allergic reaction to rituximab with her first dose, so this was discontinued.  She had an excellent response to CVP alone.  This was completed in June 2006.     She was found to have stage IA (T1c N0 M0) hormone receptor positive left breast cancer in August 2015.  She was treated with lumpectomy.  Pathology revealed a 1.1 cm, grade 2, invasive ductal carcinoma with 3 negative nodes and a close deep margin.  Estrogen and progesterone receptors were positive and her 2 Neu negative.  Ki 67 was 28%.  Oncotype DX testing revealed a recurrence score of 17, which is in the low risk category, so chemotherapy was not recommended.  She received adjuvant radiation to the left breast, which was completed in November 2015. She was placed on hormonal therapy with tamoxifen in December, as she was still premenopausal.  Within a few months, she had severe toxicities of fatigue, lightheadedness, dyspnea, and paresthesias of her hands, so tamoxifen was discontinued.  The symptoms abated when she stopped the medication.  Rather than undergo Lupron, the patient underwent a bilateral  salpingo-oophorectomy in June 2016.  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  Staging scans did not reveal any other areas of disease, so we opted for observation only.  We did eventually try her on an aromatase inhibitor for her breast cancer, but she did not tolerate this either.  She previously had B12 deficiency treated with B12 injections, but these were discontinued and she does not take oral B12 supplement.  She has not had evidence of recurrent B12 deficiency.  She has also had abnormal liver transaminases and intermittent leukopenia.  Probable hepatic steatosis was noted on CT abdomen and pelvis, as well as abdominal ultrasound.  She had previous iron deficiency, which resolved.     Due to her personal and family history of cancer she did undergo testing for hereditary cancer syndromes with the [employer] Cancer Next gene panel.  This did not reveal any clinically significant mutation or variants of uncertain significance.     Bilateral mammogram in August 2017 did not reveal any evidence of malignancy.  Ultrasound of the left breast was done due to firmness of the lumpectomy site and revealed a 3.9 cm oval mass which was circumscribed with layering debris consistent with a complex seroma.  This was stable in size and felt to be benign.  It was recommended that she have a repeat ultrasound in 6 months.  This was done March 2018 and revealed a decrease in the seroma .  The patient was seen in March 2018 because she noticed  2 red spots on the anterior chest, which appeared very similar to her prior cutaneous lymphoma.  She was referred for biopsy and the mid chest shave biopsy was a lichenoid keratosis and the skin of her right upper arm was a subcutaneous area of granulomatous and mixed inflammation.  In March 2020 CTA chest did not reveal any evidence of pulmonary embolism or other acute abnormality.  Echocardiogram revealed normal left ventricular size and  function with an ejection fraction of 60-65%.  She was added to the schedule on May 26th, as she phoned reporting a new right breast mass.  Right diagnostic mammogram and ultrasound were performed  The mammogram was negative, but the ultrasound revealed a 3 cm in diameter hypoechoic mass at 9 o'clock 1 cm from the nipple.  Biopsy was consistent with a lymphoproliferative disorder.  After reviewing the report of the stains and description, we felt this represented recurrence of her lymphoma.  PET scan revealed hypermetabolic activity in the right breast with an SUV of 5.4, but no other areas of hyper metabolism.  She was treated with right lumpectomy in June.  There was a new skin lesion of the upper chest and Dr. Amalia Hailey biopsied this at the time of her right breast lumpectomy.  The skin lesion was a benign keratosis.  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  Margins were clear.  As this was completely excised, no further therapy for the lymphoma was recommended.  She had COVID-19 in July 2020, but had very minimal symptoms.  Colonoscopy, as well as EGD were done in November 2020 by Dr. Lyndel Safe.   In February 2021, she had a palpable mass in the right breast under the nipple, which was mildly tender.  Diagnostic bilateral mammogram and bilateral breast ultrasound revealed a 2.8 cm retroareolar postoperative seroma containing a small amount of entrapped fat with overlying postsurgical scar tissue.  Normal appearing breast tissue throughout the upper outer left breast including dense glandular tissue  with a rounded peak, corresponding to the area of palpable nodularity and tenderness.  There was a no evidence of malignancy. In April 2021, she had mild anemia with a low normal ferritin. She was on an iron supplement for a time.  We recommended increasing iron in her diet.  In February 2022 , she was seen for abdominal complaints. CT chest, abdomen and pelvis was negative.    We  recommended that she see Dr.Gupta for follow up.  Bilateral diagnostic mammogram and ultrasound in March revealed suspicious hypoechoic vascular material likely within ducts of the lower outer retroareolar right breast, as well as 1 thickened right axillary lymph node with a cortex measuring 5 mm.  Ultrasound guided biopsies of the right breast and right axillary node were obtained and pathology was con breast mass consistent with lymphoma recurred with low grade lymphoma of the breast. The lymph node was negative. The histology was quite similar to her previous lymphomas.  PET scan revealed intense activity associated with the right breast mass consistent with recurrent lymphoma.  There was no evidence of hypermetabolic metastatic lymphadenopathy.  Spleen and bone marrow were normal.  There is moderate activity within the left humeral head favored to be posttraumatic or degenerative.  As her disease was localized, we recommended radiation to the right breast, which she completed in May.  INTERVAL HISTORY:  Kerri Jones is here today for repeat clinical assessment. She states she had an excellent improvement in the right breast mass with radiation.  She denies any palpable adenopathy. She denies fevers, chills or night sweats. She denies pain. She states that she had a vulvar lesion drained  By Dr. Nyra Capes about 2 years ago. This reaccumulated a couple months ago.  She got this to drain on her own, but now it has reaccumulated again. I recommended she see Dr. Nyra Capes as she treated this initially. Her appetite is good. Her weight has increased 4 pounds over last 4 months . She is up-to-date on pelvic examination, as well as colonoscopy.    REVIEW OF SYSTEMS:  Review of Systems  Constitutional:  Negative for appetite change, chills, fatigue, fever and unexpected weight change.  HENT:   Negative for lump/mass, mouth sores and sore throat.   Respiratory:  Negative for cough and shortness of breath.   Cardiovascular:   Negative for chest pain and leg swelling.  Gastrointestinal:  Negative for abdominal pain, constipation, diarrhea, nausea and vomiting.  Endocrine: Negative for hot flashes.  Genitourinary:  Negative for difficulty urinating, dysuria, frequency, hematuria, vaginal bleeding and vaginal discharge.   Musculoskeletal:  Negative for arthralgias, back pain and myalgias.  Skin:  Negative for rash.  Neurological:  Negative for dizziness and headaches.  Hematological:  Negative for adenopathy. Does not bruise/bleed easily.  Psychiatric/Behavioral:  Negative for depression and sleep disturbance. The patient is not nervous/anxious.     VITALS:  Blood pressure 137/85, pulse 67, temperature 98.3 F (36.8 C), resp. rate 16, height '5\' 5"'$  (1.651 m), weight 202 lb 11.2 oz (91.9 kg), SpO2 97 %.  Wt Readings from Last 3 Encounters:  09/27/20 202 lb 11.2 oz (91.9 kg)  05/27/20 198 lb (89.8 kg)  05/19/20 199 lb 8 oz (90.5 kg)    Body mass index is 33.73 kg/m.  Performance status (ECOG): 0 - Asymptomatic  PHYSICAL EXAM:  Physical Exam Vitals and nursing note reviewed.  Constitutional:      General: She is not in acute distress.    Appearance: Normal appearance.  HENT:     Head: Normocephalic and atraumatic.     Mouth/Throat:     Mouth: Mucous membranes are moist.     Pharynx: Oropharynx is clear. No oropharyngeal exudate or posterior oropharyngeal erythema.  Eyes:     General: No scleral icterus.    Extraocular Movements: Extraocular movements intact.     Conjunctiva/sclera: Conjunctivae normal.     Pupils: Pupils are equal, round, and reactive to light.  Cardiovascular:     Rate and Rhythm: Normal rate and regular rhythm.     Heart sounds: Normal heart sounds. No murmur heard.   No friction rub. No gallop.  Pulmonary:     Effort: Pulmonary effort is normal.     Breath sounds: Normal breath sounds. No wheezing, rhonchi or rales.  Chest:  Breasts:    Right: Normal. No swelling, bleeding,  inverted nipple, mass, nipple discharge, skin change, tenderness, axillary adenopathy or supraclavicular adenopathy.     Left: Normal. No swelling, bleeding, inverted nipple, mass, nipple discharge, skin change, tenderness, axillary adenopathy or supraclavicular adenopathy.     Comments:   There is just mild residual firmness along her right lower breast incision without mass effect. Abdominal:     General: There is no distension.     Palpations: Abdomen is soft. There is no hepatomegaly, splenomegaly or mass.     Tenderness: There is no abdominal tenderness.  Musculoskeletal:        General: Normal range of motion.     Cervical back:  Normal range of motion and neck supple. No tenderness.     Right lower leg: No edema.     Left lower leg: No edema.  Lymphadenopathy:     Cervical: No cervical adenopathy.     Upper Body:     Right upper body: No supraclavicular or axillary adenopathy.     Left upper body: No supraclavicular or axillary adenopathy.     Lower Body: No right inguinal adenopathy. No left inguinal adenopathy.  Skin:    General: Skin is warm and dry.     Coloration: Skin is not jaundiced.     Findings: No rash.  Neurological:     Mental Status: She is alert and oriented to person, place, and time.     Cranial Nerves: No cranial nerve deficit.  Psychiatric:        Mood and Affect: Mood normal.        Behavior: Behavior normal.        Thought Content: Thought content normal.   LABS:   CBC Latest Ref Rng & Units 09/27/2020 04/19/2020 12/03/2019  WBC - 3.8 4.4 3.8  Hemoglobin 12.0 - 16.0 13.0 13.2 13.3  Hematocrit 36 - 46 38 39 40  Platelets 150 - 399 175 187 196   CMP Latest Ref Rng & Units 09/27/2020 04/19/2020 12/03/2019  BUN 4 - '21 15 13 13  '$ Creatinine 0.5 - 1.1 1.0 1.0 1.0  Sodium 137 - 147 141 141 142  Potassium 3.4 - 5.3 3.8 3.9 3.8  Chloride 99 - 108 107 106 107  CO2 13 - 22 22 26(A) 25(A)  Calcium 8.7 - 10.7 9.0 9.2 9.4  Alkaline Phos 25 - 125 83 84 87  AST 13 -  35 39(A) 39(A) 38(A)  ALT 7 - 35 37(A) 35 35     No results found for: CEA1 / No results found for: CEA1 No results found for: PSA1 No results found for: [id number] No results found for: CAN125  No results found for: TOTALPROTELP, ALBUMINELP, A1GS, A2GS, BETS, BETA2SER, GAMS, MSPIKE, SPEI No results found for: TIBC, FERRITIN, IRONPCTSAT Lab Results  Component Value Date   LDH 174 09/27/2020    STUDIES:  No results found.    HISTORY:   Past Medical History:  Diagnosis Date   Breast cancer ([facility]) 10/16/2013   Cancer ([facility]) 2004/2006/2020   Low B Cell lymphoma   Chronic constipation    Family history of colon cancer    Family history of prostate cancer    GERD (gastroesophageal reflux disease)    HTN (hypertension)    Hypercholesteremia    Personal history of radiation therapy    Plantar fasciitis    Vitamin B 12 deficiency     Past Surgical History:  Procedure Laterality Date   APPENDECTOMY     BREAST LUMPECTOMY Left 2015   BREAST LUMPECTOMY Right 08/26/2018   CESAREAN SECTION     CHOLECYSTECTOMY     COLONOSCOPY  08/08/2013   Colon polyp-status post polypectomy. Small internal hemorrhoids. Otherwise normal colonoscopy.    ESOPHAGOGASTRODUODENOSCOPY  06/24/2012   Mild gastritis. Healed esophageal erosions. Otherwise normal EGD.    OOPHORECTOMY     SKIN BIOPSY     multiple   Throat Polyp removed     TONSILLECTOMY      Family History  Problem Relation Age of Onset   Colon polyps Mother    Lung cancer Father 79   Cancer Maternal Aunt 67       GIST  Stomach cancer Maternal Aunt    Colon cancer Maternal Uncle 52       dx twice at age 29   Lung cancer Maternal Grandmother 38   Prostate cancer Maternal Grandfather 64   Esophageal cancer Neg Hx    Rectal cancer Neg Hx     Social History:  reports that she has never smoked. She has never used smokeless tobacco. She reports previous drug use. She reports that she does not drink alcohol.The patient is alone  today.  Allergies:  Allergies  Allergen Reactions   Hydrocodone-Acetaminophen     Other reaction(s): Other (see comments)   Other Itching    Skin redness.   Rituxan [Rituximab]    Tape Itching    Skin redness.   Vicodin [Hydrocodone-Acetaminophen]     Current Medications: Current Outpatient Medications  Medication Sig Dispense Refill   amLODipine (NORVASC) 2.5 MG tablet Take 2.5 mg by mouth daily.     CALCIUM-MAGNESIUM-ZINC PO Take 1 tablet by mouth daily.     cholecalciferol (VITAMIN D) 1000 units tablet Take 6,000 Units by mouth daily.     citalopram (CELEXA) 10 MG tablet Take 10 mg by mouth daily.     ezetimibe (ZETIA) 10 MG tablet Take 10 mg by mouth daily.     Iron-FA-B Cmp-C-Biot-Probiotic (FUSION PLUS) CAPS TAKE 1 CAPSULE BY MOUTH ONCE DAILY 30 capsule 3   L-Lysine 1000 MG TABS Take 1,000 mg by mouth daily.     pantoprazole (PROTONIX) 40 MG tablet Take 1 tablet (40 mg total) by mouth daily. 30 tablet 11   rosuvastatin (CRESTOR) 5 MG tablet Take 5 mg by mouth daily.      No current facility-administered medications for this visit.     ASSESSMENT & PLAN:   Assessment:  1. History of low-grade cutaneous lymphoma diagnosed in 2004 and treated with surgery and chemotherapy many years ago.   2. History of IA hormone receptor positive left breast cancer in August 2015 treated with surgery and radiation.  She did not tolerate hormonal therapy.  She remains without evidence of recurrence.  She will be due for bilateral diagnostic mammogram again in March 2023.   3. 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     4. 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 This was treated with lumpectomy.  PET was otherwise negative, so no further therapy was recommended.   5. Subareolar right breast seroma, confirmed by mammogram and ultrasound.    6.  Recurrent lymphoma of the lower outer retroareolar right breast in March  2022.  There was 1 thickened right axillary lymph node, but this was not hypermetabolic on PET imaging and biopsy was negative.  There is no evidence of lymphoma elsewhere on PET. She was treated with radiation therapy with an excellent response.  She will be due for bilateral diagnostic mammogram again in March.  Plan:    We will plan to see her back in 4 months with a CBC, comprehensive metabolic panel and LDH. She will be due for bilateral diagnostic mammogram again in March.  We may wish to repeat a PET at that time as well.  We The patient understands the plans discussed today and is in agreement with them.  She knows to contact our office if she develops concerns prior to her next appointment.    Marvia Pickles, PA-C

## 2020-10-18 ENCOUNTER — Other Ambulatory Visit: Payer: BC Managed Care – PPO

## 2020-10-18 ENCOUNTER — Ambulatory Visit: Payer: BC Managed Care – PPO | Admitting: Oncology

## 2021-01-18 NOTE — Progress Notes (Signed)
[facility]  [address] [place],  [place]  [address] [phone]  Clinic Day:  01/27/2021  Referring physician: Marco Collie, MD  This document serves as a record of services personally performed by Hosie Poisson, MD. It was created on their behalf by Curry,Lauren E, a trained medical scribe. The creation of this record is based on the scribe's personal observations and the provider's statements to them.  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 female with a history of remote history of a low-grade cutaneous B-cell lymphoma, which was in remission for 10 years.  This was originally found in a skin lesion in 2004, biopsy of which was felt to represent lymphoproliferative tissue, but not definitively lymphoma.  A recurrent skin lesion of her back in 2006 was excised and pathology revealed a low-grade B-cell lymphoma.  She did not have evidence of disease elsewhere.  She received chemotherapy with CVP/rituximab.  She had an allergic reaction to rituximab with her first dose, so this was discontinued.  She had an excellent response to CVP alone.  This was completed in June 2006.     She was found to have stage IA (T1c N0 M0) hormone receptor positive left breast cancer in August 2015.  She was treated with lumpectomy.  Pathology revealed a 1.1 cm, grade 2, invasive ductal carcinoma with 3 negative nodes and a close deep margin.  Estrogen and progesterone receptors were positive and her 2 Neu negative.  Ki 67 was 28%.  Oncotype DX testing revealed a recurrence score of 17, which is in the low risk category, so chemotherapy was not recommended.  She received adjuvant radiation to the left breast, which was completed in November 2015. She was placed on hormonal therapy with tamoxifen in December, as she was still  premenopausal.  Within a few months, she had severe toxicities of fatigue, lightheadedness, dyspnea, and paresthesias of her hands, so tamoxifen was discontinued.  The symptoms abated when she stopped the medication.  Rather than undergo Lupron, the patient underwent a bilateral salpingo-oophorectomy in June 2016.  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  Staging scans did not reveal any other areas of disease, so we opted for observation only.  We did eventually try her on an aromatase inhibitor for her breast cancer, but she did not tolerate this either.  She previously had B12 deficiency treated with B12 injections, but these were discontinued and she does not take oral B12 supplement.  She has not had evidence of recurrent B12 deficiency.  She has also had abnormal liver transaminases and intermittent leukopenia.  Probable hepatic steatosis was noted on CT abdomen and pelvis, as well as abdominal ultrasound.  She had previous iron deficiency, which resolved.     Due to her personal and family history of cancer she did undergo testing for hereditary cancer syndromes with the [employer] Cancer Next gene panel.  This did not reveal any clinically significant mutation or variants of uncertain significance.     Bilateral mammogram in August 2017 did not reveal any evidence of malignancy.  Ultrasound of the left breast was done due to firmness of the lumpectomy site and revealed a 3.9 cm oval mass which was circumscribed with layering debris consistent with a complex seroma.  This was stable  in size and felt to be benign.  It was recommended that she have a repeat ultrasound in 6 months.  This was done March 2018 and revealed a decrease in the seroma .  The patient was seen in March 2018 because she noticed 2 red spots on the anterior chest, which appeared very similar to her prior cutaneous lymphoma.  She was referred for biopsy and the mid chest shave biopsy was a  lichenoid keratosis and the skin of her right upper arm was a subcutaneous area of granulomatous and mixed inflammation.  In March 2020 CTA chest did not reveal any evidence of pulmonary embolism or other acute abnormality.  Echocardiogram revealed normal left ventricular size and function with an ejection fraction of 60-65%.  She was added to the schedule on May 26th, as she phoned reporting a new right breast mass.  Right diagnostic mammogram and ultrasound were performed  The mammogram was negative, but the ultrasound revealed a 3 cm in diameter hypoechoic mass at 9 o'clock 1 cm from the nipple.  Biopsy was consistent with a lymphoproliferative disorder.  After reviewing the report of the stains and description, we felt this represented recurrence of her lymphoma.  PET scan revealed hypermetabolic activity in the right breast with an SUV of 5.4, but no other areas of hyper metabolism.  She was treated with right lumpectomy in June.  There was a new skin lesion of the upper chest and Dr. Amalia Hailey biopsied this at the time of her right breast lumpectomy.  The skin lesion was a benign keratosis.  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  Margins were clear.  As this was completely excised, no further therapy for the lymphoma was recommended.  She had COVID-19 in July 2020, but had very minimal symptoms.  Colonoscopy, as well as EGD were done in November 2020 by Dr. Lyndel Safe.   In February 2021, she had a palpable mass in the right breast under the nipple, which was mildly tender.  Diagnostic bilateral mammogram and bilateral breast ultrasound revealed a 2.8 cm retroareolar postoperative seroma containing a small amount of entrapped fat with overlying postsurgical scar tissue.  Normal appearing breast tissue throughout the upper outer left breast including dense glandular tissue  with a rounded peak, corresponding to the area of palpable nodularity and tenderness.  There was a no  evidence of malignancy. In April 2021, she had mild anemia with a low normal ferritin. She was on an iron supplement for a time.  We recommended increasing iron in her diet.  In February 2022 , she was seen for abdominal complaints. CT chest, abdomen and pelvis was negative.    We recommended that she see Dr.Gupta for follow up.  Bilateral diagnostic mammogram and ultrasound in March revealed suspicious hypoechoic vascular material likely within ducts of the lower outer retroareolar right breast, as well as 1 thickened right axillary lymph node with a cortex measuring 5 mm.  Ultrasound guided biopsies of the right breast and right axillary node were obtained and pathology was con breast mass consistent with lymphoma recurred with low grade lymphoma of the breast. The lymph node was negative. The histology was quite similar to her previous lymphomas.  PET scan revealed intense activity associated with the right breast mass consistent with recurrent lymphoma.  There was no evidence of hypermetabolic metastatic lymphadenopathy.  Spleen and bone marrow were normal.  There is moderate activity within the left humeral head favored to be posttraumatic or  degenerative.  As her disease was localized, we recommended radiation to the right breast, which she completed in May. She is up-to-date on pelvic examination, as well as colonoscopy.    INTERVAL HISTORY:  Kerri Jones is here for routine follow up and states that she is doing well and denies complaints. White count has decreased from 3.8 to 3.6 with a normal ANC of 2090, and hemoglobin and platelets are normal. I will check a B12 and folate for further evaluation. Chemistries are unremarkable. Her  appetite is good, and she has gained 1 and 1/2 pounds since her last visit.  She denies fever, chills or other signs of infection.  She denies nausea, vomiting, bowel issues, or abdominal pain.  She denies sore throat, cough, dyspnea, or chest pain.  REVIEW OF SYSTEMS:   Review of Systems  Constitutional: Negative.  Negative for appetite change, chills, fatigue, fever and unexpected weight change.  HENT:  Negative.    Eyes: Negative.   Respiratory: Negative.  Negative for chest tightness, cough, hemoptysis, shortness of breath and wheezing.   Cardiovascular: Negative.  Negative for chest pain, leg swelling and palpitations.  Gastrointestinal: Negative.  Negative for abdominal distention, abdominal pain, blood in stool, constipation, diarrhea, nausea and vomiting.  Endocrine: Negative.   Genitourinary: Negative.  Negative for difficulty urinating, dysuria, frequency and hematuria.   Musculoskeletal: Negative.  Negative for arthralgias, back pain, flank pain, gait problem and myalgias.  Skin: Negative.   Neurological: Negative.  Negative for dizziness, extremity weakness, gait problem, headaches, light-headedness, numbness, seizures and speech difficulty.  Hematological: Negative.   Psychiatric/Behavioral: Negative.  Negative for depression and sleep disturbance. The patient is not nervous/anxious.     VITALS:  Blood pressure (!) 142/81, pulse 73, temperature 98.2 F (36.8 C), temperature source Oral, resp. rate 18, height 5\' 5"  (1.651 m), weight 204 lb 4.8 oz (92.7 kg), SpO2 98 %.  Wt Readings from Last 3 Encounters:  01/27/21 204 lb 4.8 oz (92.7 kg)  09/27/20 202 lb 11.2 oz (91.9 kg)  05/27/20 198 lb (89.8 kg)    Body mass index is 34 kg/m.  Performance status (ECOG): 0 - Asymptomatic  PHYSICAL EXAM:  Physical Exam Constitutional:      General: She is not in acute distress.    Appearance: Normal appearance. She is normal weight.  HENT:     Head: Normocephalic and atraumatic.  Eyes:     General: No scleral icterus.    Extraocular Movements: Extraocular movements intact.     Conjunctiva/sclera: Conjunctivae normal.     Pupils: Pupils are equal, round, and reactive to light.  Cardiovascular:     Rate and Rhythm: Normal rate and regular rhythm.      Pulses: Normal pulses.     Heart sounds: Normal heart sounds. No murmur heard.   No friction rub. No gallop.  Pulmonary:     Effort: Pulmonary effort is normal. No respiratory distress.     Breath sounds: Normal breath sounds.  Chest:     Comments: Well healed scar in the inferior aspect of the nipple areolar complex. Left breast has a scar in the upper inner quadrant which is also well healed but slightly nodular. Both breasts are without masses. Abdominal:     General: Bowel sounds are normal. There is no distension.     Palpations: Abdomen is soft. There is no hepatomegaly, splenomegaly or mass.     Tenderness: There is no abdominal tenderness.  Musculoskeletal:        General:  Normal range of motion.     Cervical back: Normal range of motion and neck supple.     Right lower leg: Edema (trace) present.     Left lower leg: Edema (trace) present.  Lymphadenopathy:     Cervical: No cervical adenopathy.  Skin:    General: Skin is warm and dry.     Comments: Well healed scars of the skin of the back but no suspicious lesions anywhere on the skin.  Neurological:     General: No focal deficit present.     Mental Status: She is alert and oriented to person, place, and time. Mental status is at baseline.  Psychiatric:        Mood and Affect: Mood normal.        Behavior: Behavior normal.        Thought Content: Thought content normal.        Judgment: Judgment normal.   LABS:   CBC Latest Ref Rng & Units 01/27/2021 09/27/2020 04/19/2020  WBC - 3.6 3.8 4.4  Hemoglobin 12.0 - 16.0 13.1 13.0 13.2  Hematocrit 36 - 46 39 38 39  Platelets 150 - 399 186 175 187   CMP Latest Ref Rng & Units 01/27/2021 09/27/2020 04/19/2020  BUN 4 - 21 17 15 13   Creatinine 0.5 - 1.1 0.9 1.0 1.0  Sodium 137 - 147 143 141 141  Potassium 3.4 - 5.3 3.7 3.8 3.9  Chloride 99 - 108 107 107 106  CO2 13 - 22 28(A) 22 26(A)  Calcium 8.7 - 10.7 9.0 9.0 9.2  Alkaline Phos 25 - 125 74 83 84  AST 13 - 35 37(A) 39(A)  39(A)  ALT 7 - 35 27 37(A) 35   Lab Results  Component Value Date   LDH 174 09/27/2020    STUDIES:  No results found.    HISTORY:   Allergies:  Allergies  Allergen Reactions   Hydrocodone-Acetaminophen     Other reaction(s): Other (see comments)   Other Itching    Skin redness.   Rituxan [Rituximab]    Tape Itching    Skin redness.   Vicodin [Hydrocodone-Acetaminophen]     Current Medications: Current Outpatient Medications  Medication Sig Dispense Refill   amLODipine (NORVASC) 2.5 MG tablet Take 2.5 mg by mouth daily.     Barberry-Oreg Grape-Goldenseal (BERBERINE COMPLEX PO) Take by mouth. 500mg  2-3 times a day     CALCIUM-MAGNESIUM-ZINC PO Take 1 tablet by mouth daily.     cholecalciferol (VITAMIN D) 1000 units tablet Take 6,000 Units by mouth daily.     citalopram (CELEXA) 10 MG tablet Take 10 mg by mouth daily.     ezetimibe (ZETIA) 10 MG tablet Take 10 mg by mouth daily.     GLUTATHIONE PO Take by mouth. 2-3 times a day     Iron-FA-B Cmp-C-Biot-Probiotic (FUSION PLUS) CAPS TAKE 1 CAPSULE BY MOUTH ONCE DAILY 30 capsule 3   L-Lysine 1000 MG TABS Take 1,000 mg by mouth daily.     NONFORMULARY OR COMPOUNDED ITEM Vitamin E once a day     pantoprazole (PROTONIX) 40 MG tablet Take 1 tablet (40 mg total) by mouth daily. 30 tablet 11   rosuvastatin (CRESTOR) 5 MG tablet Take 5 mg by mouth daily.      No current facility-administered medications for this visit.     ASSESSMENT & PLAN:   Assessment: 1. History of low-grade cutaneous lymphoma diagnosed in 2004 and treated with surgery and chemotherapy many years ago.  2. History of IA hormone receptor positive left breast cancer in August 2015 treated with surgery and radiation.  She did not tolerate hormonal therapy.  She remains without evidence of recurrence.  She will be due for bilateral diagnostic mammogram again in March 2023.   3. 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     4. 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 This was treated with lumpectomy.  PET was otherwise negative, so no further therapy was recommended.   5. Subareolar right breast seroma, confirmed by mammogram and ultrasound.    6.  Recurrent lymphoma of the lower outer retroareolar right breast in March 2022.  There was 1 thickened right axillary lymph node, but this was not hypermetabolic on PET imaging and biopsy was negative.  There is no evidence of lymphoma elsewhere on PET. She was treated with radiation therapy with an excellent response.  She will be due for bilateral diagnostic mammogram again in March.  Plan:     We will plan to see her back in 4 months with a CBC, comprehensive metabolic panel, LDH and bilateral diagnostic mammogram. The patient understands the plans discussed today and is in agreement with them.  She knows to contact our office if she develops concerns prior to her next appointment.  I provided 15 minutes of face-to-face time during this this encounter and > 50% was spent counseling as documented under my assessment and plan.    I, Rita Ohara, am acting as scribe for Derwood Kaplan, MD  I have reviewed this report as typed by the medical scribe, and it is complete and accurate.

## 2021-01-27 ENCOUNTER — Inpatient Hospital Stay: Payer: BC Managed Care – PPO | Attending: Oncology

## 2021-01-27 ENCOUNTER — Other Ambulatory Visit: Payer: Self-pay | Admitting: Hematology and Oncology

## 2021-01-27 ENCOUNTER — Encounter: Payer: Self-pay | Admitting: Oncology

## 2021-01-27 ENCOUNTER — Inpatient Hospital Stay (INDEPENDENT_AMBULATORY_CARE_PROVIDER_SITE_OTHER): Payer: BC Managed Care – PPO | Admitting: Oncology

## 2021-01-27 ENCOUNTER — Other Ambulatory Visit: Payer: Self-pay | Admitting: Oncology

## 2021-01-27 ENCOUNTER — Telehealth: Payer: Self-pay | Admitting: Oncology

## 2021-01-27 VITALS — BP 142/81 | HR 73 | Temp 98.2°F | Resp 18 | Ht 65.0 in | Wt 204.3 lb

## 2021-01-27 DIAGNOSIS — C50212 Malignant neoplasm of upper-inner quadrant of left female breast: Secondary | ICD-10-CM

## 2021-01-27 DIAGNOSIS — Z170 Estrogen receptor positive status [ER+]: Secondary | ICD-10-CM

## 2021-01-27 DIAGNOSIS — D519 Vitamin B12 deficiency anemia, unspecified: Secondary | ICD-10-CM

## 2021-01-27 DIAGNOSIS — C8589 Other specified types of non-Hodgkin lymphoma, extranodal and solid organ sites: Secondary | ICD-10-CM | POA: Insufficient documentation

## 2021-01-27 DIAGNOSIS — C50912 Malignant neoplasm of unspecified site of left female breast: Secondary | ICD-10-CM | POA: Diagnosis not present

## 2021-01-27 DIAGNOSIS — D72819 Decreased white blood cell count, unspecified: Secondary | ICD-10-CM

## 2021-01-27 DIAGNOSIS — C8590 Non-Hodgkin lymphoma, unspecified, unspecified site: Secondary | ICD-10-CM | POA: Diagnosis not present

## 2021-01-27 DIAGNOSIS — Z79899 Other long term (current) drug therapy: Secondary | ICD-10-CM | POA: Insufficient documentation

## 2021-01-27 LAB — CBC AND DIFFERENTIAL
HCT: 39 (ref 36–46)
Hemoglobin: 13.1 (ref 12.0–16.0)
Neutrophils Absolute: 2.09
Platelets: 186 (ref 150–399)
WBC: 3.6

## 2021-01-27 LAB — VITAMIN B12: Vitamin B-12: 309 pg/mL (ref 180–914)

## 2021-01-27 LAB — COMPREHENSIVE METABOLIC PANEL
Albumin: 4.4 (ref 3.5–5.0)
Calcium: 9 (ref 8.7–10.7)

## 2021-01-27 LAB — BASIC METABOLIC PANEL
BUN: 17 (ref 4–21)
CO2: 28 — AB (ref 13–22)
Chloride: 107 (ref 99–108)
Creatinine: 0.9 (ref 0.5–1.1)
Glucose: 86
Potassium: 3.7 (ref 3.4–5.3)
Sodium: 143 (ref 137–147)

## 2021-01-27 LAB — CBC: RBC: 4.79 (ref 3.87–5.11)

## 2021-01-27 LAB — HEPATIC FUNCTION PANEL
ALT: 27 (ref 7–35)
AST: 37 — AB (ref 13–35)
Alkaline Phosphatase: 74 (ref 25–125)
Bilirubin, Total: 0.5

## 2021-01-27 LAB — FOLATE: Folate: 8.7 ng/mL (ref >5.9)

## 2021-01-27 LAB — LACTATE DEHYDROGENASE: LDH: 180 U/L (ref 98–192)

## 2021-01-27 NOTE — Telephone Encounter (Signed)
Per 12/1 LOS, patient scheduled for March 2023 Appt's.  Gave patient Appt Summary/Mammo Order

## 2021-02-02 ENCOUNTER — Other Ambulatory Visit: Payer: Self-pay | Admitting: Oncology

## 2021-02-02 DIAGNOSIS — D519 Vitamin B12 deficiency anemia, unspecified: Secondary | ICD-10-CM

## 2021-02-03 ENCOUNTER — Telehealth: Payer: Self-pay

## 2021-02-03 NOTE — Telephone Encounter (Signed)
-----   Message from Derwood Kaplan, MD sent at 02/02/2021  7:04 PM EST ----- Regarding: call Tell her B12 low normal, I rec 500 mcg po daily and we'll recheck in the spring

## 2021-02-03 NOTE — Telephone Encounter (Signed)
Attempted to contact patient but went straight to voice mail.

## 2021-02-04 ENCOUNTER — Telehealth: Payer: Self-pay

## 2021-02-04 NOTE — Telephone Encounter (Signed)
Patient notified

## 2021-02-04 NOTE — Telephone Encounter (Signed)
-----   Message from Derwood Kaplan, MD sent at 02/02/2021  7:04 PM EST ----- Regarding: call Tell her B12 low normal, I rec 500 mcg po daily and we'll recheck in the spring

## 2021-05-02 ENCOUNTER — Telehealth: Payer: Self-pay | Admitting: Oncology

## 2021-05-02 NOTE — Telephone Encounter (Signed)
Pt contacted the office requesting her mammogram to be R/S'd. Pt and her spouse are going out of town and she will not be able to make the appt. Marland Kitchen  ?Mammogram was R/S'd and per pt request labs and follow-up appts were also R/S'd so that she could get the results at her appt. ?

## 2021-05-12 ENCOUNTER — Ambulatory Visit: Payer: BC Managed Care – PPO | Admitting: Oncology

## 2021-05-12 ENCOUNTER — Other Ambulatory Visit: Payer: BC Managed Care – PPO

## 2021-06-08 ENCOUNTER — Encounter: Payer: Self-pay | Admitting: Oncology

## 2021-06-08 NOTE — Progress Notes (Signed)
?[facility]  ?[address] ?[place],  [place]  [address] ?[phone] ? ?Clinic Day:  06/09/21 ? ?Referring physician: Marco Collie, MD ? 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 ?Current Treatment:   Observation ? ? ?HISTORY OF PRESENT ILLNESS:  ?Kerri Jones is a 56 y.o. female with a history of remote history of a low-grade cutaneous B-cell lymphoma, which was in remission for 10 years.  This was originally found in a skin lesion in 2004, biopsy of which was felt to represent lymphoproliferative tissue, but not definitively lymphoma.  A recurrent skin lesion of her back in 2006 was excised and pathology revealed a low-grade B-cell lymphoma.  She did not have evidence of disease elsewhere.  She received chemotherapy with CVP/rituximab.  She had an allergic reaction to rituximab with her first dose, so this was discontinued.  She had an excellent response to CVP alone.  This was completed in June 2006.   ?  ?She was found to have stage IA (T1c N0 M0) hormone receptor positive left breast cancer in August 2015.  She was treated with lumpectomy.  Pathology revealed a 1.1 cm, grade 2, invasive ductal carcinoma with 3 negative nodes and a close deep margin.  Estrogen and progesterone receptors were positive and her 2 Neu negative.  Ki 67 was 28%.  Oncotype DX testing revealed a recurrence score of 17, which is in the low risk category, so chemotherapy was not recommended.  She received adjuvant radiation to the left breast, which was completed in November 2015. She was placed on hormonal therapy with tamoxifen in December, as she was still premenopausal.  Within a few months, she had severe toxicities of fatigue, lightheadedness, dyspnea, and paresthesias of her hands, so tamoxifen was discontinued.  The symptoms abated when she stopped the medication.  Rather than undergo Lupron, the patient underwent a bilateral  salpingo-oophorectomy in June 2016.  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  Staging scans did not reveal any other areas of disease, so we opted for observation only.  We did eventually try her on an aromatase inhibitor for her breast cancer, but she did not tolerate this either.  She previously had B12 deficiency treated with B12 injections, but these were discontinued and she does not take oral B12 supplement.  She has not had evidence of recurrent B12 deficiency.  She has also had abnormal liver transaminases and intermittent leukopenia.  Probable hepatic steatosis was noted on CT abdomen and pelvis, as well as abdominal ultrasound.  She had previous iron deficiency, which resolved.   ?  ?Due to her personal and family history of cancer she did undergo testing for hereditary cancer syndromes with the [employer] Cancer Next gene panel.  This did not reveal any clinically significant mutation or variants of uncertain significance.   ?  ?Bilateral mammogram in August 2017 did not reveal any evidence of malignancy.  Ultrasound of the left breast was done due to firmness of the lumpectomy site and revealed a 3.9 cm oval mass which was circumscribed with layering debris consistent with a complex seroma.  This was stable in size and felt to be benign.  It was recommended that she have a repeat ultrasound in 6 months.  This was done March 2018 and revealed a decrease in the seroma .  The patient was seen in March 2018 because she noticed 2  red spots on the anterior chest, which appeared very similar to her prior cutaneous lymphoma.  She was referred for biopsy and the mid chest shave biopsy was a lichenoid keratosis and the skin of her right upper arm was a subcutaneous area of granulomatous and mixed inflammation.  In March 2020 CTA chest did not reveal any evidence of pulmonary embolism or other acute abnormality.  Echocardiogram revealed normal left ventricular size and  function with an ejection fraction of 60-65%.  She was added to the schedule on May 26th, as she phoned reporting a new right breast mass.  Right diagnostic mammogram and ultrasound were performed  The mammogram was negative, but the ultrasound revealed a 3 cm in diameter hypoechoic mass at 9 o'clock 1 cm from the nipple.  Biopsy was consistent with a lymphoproliferative disorder.  After reviewing the report of the stains and description, we felt this represented recurrence of her lymphoma.  PET scan revealed hypermetabolic activity in the right breast with an SUV of 5.4, but no other areas of hyper metabolism.  She was treated with right lumpectomy in June. 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  Margins were clear.  As this was completely excised, no further therapy for the lymphoma was recommended.  She had COVID-19 in July 2020, but had very minimal symptoms.  Colonoscopy, as well as EGD were done in November 2020 by Dr. Lyndel Safe. ?  ?In February 2021, she had a palpable mass in the right breast under the nipple, which was mildly tender.  Diagnostic bilateral mammogram and bilateral breast ultrasound revealed a 2.8 cm retroareolar postoperative seroma containing a small amount of entrapped fat with overlying postsurgical scar tissue.  Normal appearing breast tissue throughout the upper outer left breast including dense glandular tissue  with a rounded peak, corresponding to the area of palpable nodularity and tenderness.  There was a no evidence of malignancy. In April 2021, she had mild anemia with a low normal ferritin. She was on an iron supplement for a time.  We recommended increasing iron in her diet. ? ?In February 2022 , she was seen for abdominal complaints. CT chest, abdomen and pelvis was negative.    We recommended that she see Dr.Gupta for follow up.  Bilateral diagnostic mammogram and ultrasound in March revealed suspicious hypoechoic vascular material likely  within ducts of the lower outer retroareolar right breast, as well as 1 thickened right axillary lymph node with a cortex measuring 5 mm.  Ultrasound guided biopsies of the right breast and right axillary node were obtained and pathology was  consistent with low grade lymphoma of the breast. The lymph node was negative. The histology was quite similar to her previous lymphomas.  PET scan revealed intense activity associated with the right breast mass consistent with recurrent lymphoma.  There was no evidence of hypermetabolic metastatic lymphadenopathy elsewhere.  Spleen and bone marrow were normal.  There is moderate activity within the left humeral head favored to be posttraumatic or degenerative.  As her disease was localized, we recommended radiation to the right breast, which she completed in May. She is up-to-date on pelvic examination, as well as colonoscopy.   ? ?INTERVAL HISTORY:  ?Roben is here for routine follow up and states that she is doing well and denies complaints. White count has increased from 3.6 to 3.8 with a normal ANC of 2240, and  platelets are normal.  Her hemoglobin is 12.0 with an MCV of 80.  B12  and folate in December were normal.  Chemistries are unremarkable.  She had a mammogram on June 07, 2021 and this was negative for malignancy.  Her breasts are heterogeneously dense. Her  appetite is good, and she has gained 2 pounds since her last visit.  She denies fever, chills or other signs of infection.  She denies nausea, vomiting, bowel issues, or abdominal pain.  She denies sore throat, cough, dyspnea, or chest pain. ? ?REVIEW OF SYSTEMS:  ?Review of Systems  ?Constitutional: Negative.  Negative for appetite change, chills, fatigue, fever and unexpected weight change.  ?HENT:  Negative.    ?Eyes: Negative.   ?Respiratory: Negative.  Negative for chest tightness, cough, hemoptysis, shortness of breath and wheezing.   ?Cardiovascular: Negative.  Negative for chest pain, leg swelling and  palpitations.  ?Gastrointestinal: Negative.  Negative for abdominal distention, abdominal pain, blood in stool, constipation, diarrhea, nausea and vomiting.  ?Endocrine: Negative.   ?Genitourinary: Negative.  Negative for diffi

## 2021-06-09 ENCOUNTER — Inpatient Hospital Stay: Payer: BC Managed Care – PPO | Admitting: Oncology

## 2021-06-09 ENCOUNTER — Inpatient Hospital Stay: Payer: BC Managed Care – PPO | Attending: Oncology

## 2021-06-09 ENCOUNTER — Telehealth: Payer: Self-pay | Admitting: Oncology

## 2021-06-09 ENCOUNTER — Other Ambulatory Visit: Payer: Self-pay | Admitting: Oncology

## 2021-06-09 VITALS — BP 148/87 | HR 66 | Temp 97.9°F | Resp 18 | Ht 65.0 in | Wt 206.5 lb

## 2021-06-09 DIAGNOSIS — Z90721 Acquired absence of ovaries, unilateral: Secondary | ICD-10-CM | POA: Insufficient documentation

## 2021-06-09 DIAGNOSIS — C50912 Malignant neoplasm of unspecified site of left female breast: Secondary | ICD-10-CM

## 2021-06-09 DIAGNOSIS — D519 Vitamin B12 deficiency anemia, unspecified: Secondary | ICD-10-CM | POA: Diagnosis not present

## 2021-06-09 DIAGNOSIS — Z8572 Personal history of non-Hodgkin lymphomas: Secondary | ICD-10-CM | POA: Diagnosis present

## 2021-06-09 DIAGNOSIS — Z170 Estrogen receptor positive status [ER+]: Secondary | ICD-10-CM

## 2021-06-09 DIAGNOSIS — C8219 Follicular lymphoma grade II, extranodal and solid organ sites: Secondary | ICD-10-CM | POA: Diagnosis not present

## 2021-06-09 DIAGNOSIS — Z853 Personal history of malignant neoplasm of breast: Secondary | ICD-10-CM | POA: Insufficient documentation

## 2021-06-09 DIAGNOSIS — Z923 Personal history of irradiation: Secondary | ICD-10-CM | POA: Diagnosis not present

## 2021-06-09 DIAGNOSIS — Z8639 Personal history of other endocrine, nutritional and metabolic disease: Secondary | ICD-10-CM | POA: Insufficient documentation

## 2021-06-09 LAB — BASIC METABOLIC PANEL
BUN: 11 (ref 4–21)
CO2: 26 — AB (ref 13–22)
Chloride: 106 (ref 99–108)
Creatinine: 1 (ref 0.5–1.1)
Glucose: 93
Potassium: 3.5 mEq/L (ref 3.5–5.1)
Sodium: 140 (ref 137–147)

## 2021-06-09 LAB — VITAMIN B12: Vitamin B-12: 755 pg/mL (ref 180–914)

## 2021-06-09 LAB — CBC AND DIFFERENTIAL
HCT: 37 (ref 36–46)
Hemoglobin: 12 (ref 12.0–16.0)
Neutrophils Absolute: 2.24
Platelets: 188 10*3/uL (ref 150–400)
WBC: 3.8

## 2021-06-09 LAB — COMPREHENSIVE METABOLIC PANEL
Albumin: 4.1 (ref 3.5–5.0)
Calcium: 8.6 — AB (ref 8.7–10.7)

## 2021-06-09 LAB — HEPATIC FUNCTION PANEL
ALT: 25 U/L (ref 7–35)
AST: 32 (ref 13–35)
Alkaline Phosphatase: 81 (ref 25–125)
Bilirubin, Total: 0.5

## 2021-06-09 LAB — CBC: RBC: 4.58 (ref 3.87–5.11)

## 2021-06-09 NOTE — Telephone Encounter (Signed)
Per 06/09/21 los next appt scheduled and confirmed with patient ?

## 2021-06-10 ENCOUNTER — Other Ambulatory Visit: Payer: Self-pay

## 2021-06-10 DIAGNOSIS — Z8572 Personal history of non-Hodgkin lymphomas: Secondary | ICD-10-CM | POA: Diagnosis not present

## 2021-06-10 DIAGNOSIS — C8219 Follicular lymphoma grade II, extranodal and solid organ sites: Secondary | ICD-10-CM

## 2021-06-10 LAB — LACTATE DEHYDROGENASE: LDH: 177 U/L (ref 98–192)

## 2021-06-20 ENCOUNTER — Encounter: Payer: Self-pay | Admitting: Oncology

## 2021-08-26 ENCOUNTER — Ambulatory Visit: Payer: BC Managed Care – PPO | Admitting: Gastroenterology

## 2021-08-31 ENCOUNTER — Other Ambulatory Visit: Payer: Self-pay

## 2021-08-31 MED ORDER — PANTOPRAZOLE SODIUM 40 MG PO TBEC: 40.0000 mg | DELAYED_RELEASE_TABLET | Freq: Every day | ORAL | 3 refills | Status: DC

## 2021-10-05 ENCOUNTER — Encounter: Payer: Self-pay | Admitting: Gastroenterology

## 2021-10-05 ENCOUNTER — Ambulatory Visit: Payer: BC Managed Care – PPO | Admitting: Gastroenterology

## 2021-10-05 VITALS — BP 110/80 | HR 72 | Ht 63.5 in | Wt 210.0 lb

## 2021-10-05 DIAGNOSIS — K219 Gastro-esophageal reflux disease without esophagitis: Secondary | ICD-10-CM | POA: Diagnosis not present

## 2021-10-05 DIAGNOSIS — K449 Diaphragmatic hernia without obstruction or gangrene: Secondary | ICD-10-CM | POA: Diagnosis not present

## 2021-10-05 MED ORDER — PANTOPRAZOLE SODIUM 40 MG PO TBEC: 40.0000 mg | DELAYED_RELEASE_TABLET | Freq: Every day | ORAL | 4 refills | Status: DC

## 2021-10-05 NOTE — Progress Notes (Signed)
Chief Complaint: FU  Referring Provider:  Marco Collie, MD      ASSESSMENT AND PLAN;   #1. GERD with small HH  #2. IDA (resolved). H/O B12 deficiency. 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 H/O lymphoma right ovary 2016 (treated with surgery alone). H/O right breast follicular lymphoma s/p lumpectomy/XRT.   #4. 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 Neg CT AP 04/29/2020  Plan: - Continue protonix '40mg'$  po QD #90, 4 RF - Call if problems. - Rpt screening colon Nov 2030. - FU Dr Hinton Rao.     HPI:    Kerri Jones is a 56 y.o. female  For FU visit.  Here for medication refill.  Was doing very well with Dexilant.  Unfortunately, insurance stopped covering.  She has been switched to Protonix which helps but not as much.  We will try to get her from drug company.  Seen by Dr. Hinton Rao. Had R breast palpable lesion.  This was followed by mammography and then biopsy revealing recurrence of lymphoma, confirmed on PET scan.  She underwent surgery/radiation.  Negative recent mammography.  She has appointment with Dr. Hinton Rao in October 2023.  They are thinking about getting another PET scan or CT chest/Abdo/pelvis at follow-up.  No significant GI complaints.  Constipation is better.  Abdo pain is much better as well.  No rectal bleeding.  Most recent labs on 04/19/2020 showed hemoglobin to be normal at 13.2, AST 39, ALT 35.  She had normal creatinine at 1.0.  Past GI workup: -Negative EGD/colonoscopy November 2020. Rpt colon in 10 yrs. -Had CT chest Abdo/pelvis on 04/29/2020 which was unremarkable  Past Medical History:  Diagnosis Date   Breast cancer ([place]) 10/16/2013   Cancer ([place]) 2004/2006/2020   Low B Cell lymphoma   Chronic constipation    Family history of colon cancer    Family history of prostate cancer     GERD (gastroesophageal reflux disease)    HTN (hypertension)    Hypercholesteremia    Personal history of radiation therapy    Plantar fasciitis    Vitamin B 12 deficiency     Past Surgical History:  Procedure Laterality Date   APPENDECTOMY     BREAST LUMPECTOMY Left 2015   BREAST LUMPECTOMY Right 08/26/2018   CESAREAN SECTION     CHOLECYSTECTOMY     COLONOSCOPY  08/08/2013   Colon polyp-status post polypectomy. Small internal hemorrhoids. Otherwise normal colonoscopy.    ESOPHAGOGASTRODUODENOSCOPY  06/24/2012   Mild gastritis. Healed esophageal erosions. Otherwise normal EGD.    OOPHORECTOMY     SKIN BIOPSY     multiple   Throat Polyp removed     TONSILLECTOMY      Family History  Problem Relation Age of Onset   Colon polyps Mother    Lung cancer Father 79   Cancer Maternal Aunt 51       GIST   Stomach cancer Maternal Aunt    Colon cancer Maternal Uncle 79       dx twice at age 100   Lung cancer Maternal Grandmother 68   Prostate cancer Maternal Grandfather 64   Esophageal cancer Neg Hx    Rectal cancer Neg Hx     Social History   Tobacco Use   Smoking status: Never   Smokeless tobacco: Never  Vaping Use   Vaping Use:  Never used  Substance Use Topics   Alcohol use: No   Drug use: Not Currently    Current Outpatient Medications  Medication Sig Dispense Refill   amLODipine (NORVASC) 2.5 MG tablet Take 2.5 mg by mouth daily.     CALCIUM-MAGNESIUM-ZINC PO Take 1 tablet by mouth daily.     cholecalciferol (VITAMIN D) 1000 units tablet Take 6,000 Units by mouth daily.     citalopram (CELEXA) 10 MG tablet Take 10 mg by mouth daily.     ezetimibe (ZETIA) 10 MG tablet Take 10 mg by mouth daily.     GLUTATHIONE PO Take by mouth. 2-3 times a day     L-Lysine 1000 MG TABS Take 1,000 mg by mouth daily.     NONFORMULARY OR COMPOUNDED ITEM Vitamin E once a day     pantoprazole (PROTONIX) 40 MG tablet Take 1 tablet (40 mg total) by mouth daily. 30 tablet 3    rosuvastatin (CRESTOR) 5 MG tablet Take 5 mg by mouth daily.      No current facility-administered medications for this visit.    Allergies  Allergen Reactions   Hydrocodone-Acetaminophen     Other reaction(s): Other (see comments)   Rituxan [Rituximab]    Tape Itching    Skin redness.   Vicodin [Hydrocodone-Acetaminophen]     Review of Systems:  neg     Physical Exam:    BP 110/80 (BP Location: Right Arm, Patient Position: Sitting, Cuff Size: Large)   Pulse 72   Ht 5' 3.5" (1.613 m) Comment: height measured without shoes  Wt 210 lb (95.3 kg)   BMI 36.62 kg/m  Filed Weights   10/05/21 1337  Weight: 210 lb (95.3 kg)   Constitutional:  Well-developed, in no acute distress. Psychiatric: Normal mood and affect. Behavior is normal. Pulmonary/chest: Effort normal and breath sounds normal. No wheezing, rales or rhonchi. Abdominal: Soft, nondistended. Nontender. Bowel sounds active throughout. There are no masses palpable. No hepatomegaly.  Reviewed Dr. Remi Deter last note and labs.    Carmell Austria, MD 10/05/2021, 1:53 PM  Cc: Marco Collie, MD  Dr Hosie Poisson.

## 2021-10-05 NOTE — Patient Instructions (Addendum)
_______________________________________________________  If you are age 57 or older, your body mass index should be between 23-30. Your Body mass index is 36.62 kg/m. If this is out of the aforementioned range listed, please consider follow up with your Primary Care Provider.  If you are age 38 or younger, your body mass index should be between 19-25. Your Body mass index is 36.62 kg/m. If this is out of the aformentioned range listed, please consider follow up with your Primary Care Provider.   ________________________________________________________  The [facility] providers would like to encourage you to use [facility] to communicate with providers for non-urgent requests or questions.  Due to long hold times on the telephone, sending your provider a message by [facility] may be a faster and more efficient way to get a response.  Please allow 48 business hours for a response.  Please remember that this is for non-urgent requests.  _______________________________________________________  We have sent the following medications to your pharmacy for you to pick up at your convenience: Protonix.  Follow up in 1 year.  Thank you,  Dr. Jackquline Denmark

## 2021-10-06 ENCOUNTER — Telehealth: Payer: Self-pay

## 2021-10-06 ENCOUNTER — Other Ambulatory Visit: Payer: Self-pay

## 2021-10-06 NOTE — Telephone Encounter (Signed)
LVM for patient  I have mailed her patient assistance for dexilant. Please date Dr signature and please mail or fax back to the company on form.

## 2021-12-09 ENCOUNTER — Other Ambulatory Visit: Payer: Self-pay | Admitting: Oncology

## 2021-12-09 ENCOUNTER — Inpatient Hospital Stay: Payer: BC Managed Care – PPO

## 2021-12-09 ENCOUNTER — Telehealth: Payer: Self-pay

## 2021-12-09 ENCOUNTER — Encounter: Payer: Self-pay | Admitting: Oncology

## 2021-12-09 ENCOUNTER — Inpatient Hospital Stay: Payer: BC Managed Care – PPO | Attending: Oncology | Admitting: Oncology

## 2021-12-09 VITALS — BP 139/91 | HR 72 | Temp 98.0°F | Resp 18 | Ht 63.5 in | Wt 207.7 lb

## 2021-12-09 DIAGNOSIS — Z170 Estrogen receptor positive status [ER+]: Secondary | ICD-10-CM | POA: Diagnosis not present

## 2021-12-09 DIAGNOSIS — C8590 Non-Hodgkin lymphoma, unspecified, unspecified site: Secondary | ICD-10-CM

## 2021-12-09 DIAGNOSIS — C8219 Follicular lymphoma grade II, extranodal and solid organ sites: Secondary | ICD-10-CM

## 2021-12-09 DIAGNOSIS — R7401 Elevation of levels of liver transaminase levels: Secondary | ICD-10-CM | POA: Insufficient documentation

## 2021-12-09 DIAGNOSIS — C50912 Malignant neoplasm of unspecified site of left female breast: Secondary | ICD-10-CM

## 2021-12-09 DIAGNOSIS — C8599 Non-Hodgkin lymphoma, unspecified, extranodal and solid organ sites: Secondary | ICD-10-CM | POA: Insufficient documentation

## 2021-12-09 DIAGNOSIS — D72819 Decreased white blood cell count, unspecified: Secondary | ICD-10-CM | POA: Diagnosis not present

## 2021-12-09 DIAGNOSIS — Z923 Personal history of irradiation: Secondary | ICD-10-CM | POA: Insufficient documentation

## 2021-12-09 LAB — HEPATIC FUNCTION PANEL
ALT: 44 U/L — AB (ref 7–35)
AST: 44 — AB (ref 13–35)
Alkaline Phosphatase: 93 (ref 25–125)
Bilirubin, Total: 0.8

## 2021-12-09 LAB — CBC AND DIFFERENTIAL
HCT: 41 (ref 36–46)
Hemoglobin: 13.8 (ref 12.0–16.0)
Neutrophils Absolute: 2.44
Platelets: 195 10*3/uL (ref 150–400)
WBC: 4

## 2021-12-09 LAB — BASIC METABOLIC PANEL
BUN: 15 (ref 4–21)
CO2: 23 — AB (ref 13–22)
Chloride: 109 — AB (ref 99–108)
Creatinine: 1.1 (ref 0.5–1.1)
Glucose: 100
Potassium: 3.9 mEq/L (ref 3.5–5.1)
Sodium: 142 (ref 137–147)

## 2021-12-09 LAB — CBC: RBC: 5.07 (ref 3.87–5.11)

## 2021-12-09 LAB — COMPREHENSIVE METABOLIC PANEL
Albumin: 4.4 (ref 3.5–5.0)
Calcium: 9.4 (ref 8.7–10.7)

## 2021-12-09 LAB — LACTATE DEHYDROGENASE: LDH: 194 U/L — ABNORMAL HIGH (ref 98–192)

## 2021-12-09 NOTE — Progress Notes (Signed)
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 female with a history of remote history of a low-grade cutaneous B-cell lymphoma, which was in remission for 10 years.  This was originally found in a skin lesion in 2004, biopsy of which was felt to represent lymphoproliferative tissue, but not definitively lymphoma.  A recurrent skin lesion of her back in 2006 was excised and pathology revealed a low-grade B-cell lymphoma.  She did not have evidence of disease elsewhere.  She received chemotherapy with CVP/rituximab.  She had an allergic reaction to rituximab with her first dose, so this was discontinued.  She had an excellent response to CVP alone.  This was completed in June 2006.     She was found to have stage IA (T1c N0 M0) hormone receptor positive left breast cancer in August 2015.  She was treated with lumpectomy.  Pathology revealed a 1.1 cm, grade 2, invasive ductal carcinoma with 3 negative nodes and a close deep margin.  Estrogen and progesterone receptors were positive and her 2 Neu negative.  Ki 67 was 28%.  Oncotype DX testing revealed a recurrence score of 17, which is in the low risk category, so chemotherapy was not recommended.  She received adjuvant radiation to the left breast, which was completed in November 2015. She was placed on hormonal therapy with tamoxifen in December, as she was still premenopausal.  Within a few months, she had severe toxicities of fatigue, lightheadedness, dyspnea, and paresthesias of her hands, so tamoxifen was discontinued.  The symptoms abated when she stopped the medication.  Rather than undergo Lupron, the patient underwent a bilateral  salpingo-oophorectomy in June 2016.  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  Staging scans did not reveal any other areas of disease, so we opted for observation only.  We did eventually try her on an aromatase inhibitor for her breast cancer, but she did not tolerate this either.  She previously had B12 deficiency treated with B12 injections, but these were discontinued and she does not take oral B12 supplement.  She has not had evidence of recurrent B12 deficiency.  She has also had abnormal liver transaminases and intermittent leukopenia.  Probable hepatic steatosis was noted on CT abdomen and pelvis, as well as abdominal ultrasound.  She had previous iron deficiency, which resolved.     Due to her personal and family history of cancer she did undergo testing for hereditary cancer syndromes with the [employer] Cancer Next gene panel.  This did not reveal any clinically significant mutation or variants of uncertain significance.     Bilateral mammogram in August 2017 did not reveal any evidence of malignancy.  Ultrasound of the left breast was done due to firmness of the lumpectomy site and revealed a 3.9 cm oval mass which was circumscribed with layering debris consistent with a complex seroma.  This was stable in size and felt to be benign.   Echocardiogram in March of 2020 revealed normal left ventricular size and function with an ejection fraction of 60-65%.  She was added to the schedule on May 26th, as she phoned reporting a new right breast mass.  Right diagnostic mammogram and ultrasound were performed  The mammogram was negative, but the ultrasound revealed a 3 cm in diameter hypoechoic mass at 9 o'clock 1 cm from the nipple.  Biopsy was consistent with a lymphoproliferative disorder.  After reviewing the report of the stains and description, we felt this represented recurrence of her lymphoma.  PET scan revealed hypermetabolic activity in the right breast  with an SUV of 5.4, but no other areas of hyper metabolism.  She was treated with right lumpectomy in June. 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  Margins were clear.  As this was completely excised, no further therapy for the lymphoma was recommended.  She had COVID-19 in July 2020, but had very minimal symptoms.  Colonoscopy, as well as EGD were done in November 2020 by Dr. Lyndel Safe.   In February 2021, she had a palpable mass in the right breast under the nipple, which was mildly tender.  Diagnostic bilateral mammogram and bilateral breast ultrasound revealed a 2.8 cm retroareolar postoperative seroma containing a small amount of entrapped fat with overlying postsurgical scar tissue.  Normal appearing breast tissue throughout the upper outer left breast including dense glandular tissue  with a rounded peak, corresponding to the area of palpable nodularity and tenderness.  There was a no evidence of malignancy. In April 2021, she had mild anemia with a low normal ferritin. She was on an iron supplement for a time.  We recommended increasing iron in her diet.  In February 2022 , she was seen for abdominal complaints. CT chest, abdomen and pelvis was negative.    We recommended that she see Dr.Gupta for follow up.  Bilateral diagnostic mammogram and ultrasound in March revealed suspicious hypoechoic vascular material likely within ducts of the lower outer retroareolar right breast, as well as 1 thickened right axillary lymph node with a cortex measuring 5 mm.  Ultrasound guided biopsies of the right breast and right axillary node were obtained and pathology was  consistent with low grade lymphoma of the breast. The lymph node was negative. The histology was quite similar to her previous lymphomas.  PET scan revealed intense activity associated with the right breast mass consistent with recurrent lymphoma.  There was no evidence of hypermetabolic metastatic lymphadenopathy  elsewhere.  Spleen and bone marrow were normal.  There is moderate activity within the left humeral head favored to be posttraumatic or degenerative.  As her disease was localized, we recommended radiation to the right breast, which she completed in May. She is up-to-date on pelvic examination, as well as colonoscopy.    INTERVAL HISTORY:  Kerri Jones is here for routine follow up and states that she is doing well and denies complaints. White count has increased to 4.0 with a normal ANC and  platelets are normal.  Her hemoglobin is 13.8 with an MCV of 80.  B12 and folate in December were normal.  Chemistries are unremarkable except for mild elevation of the SGOT to 44 and SGPT to 44.  She had a mammogram on June 07, 2021 and this was negative for malignancy.  Her breasts are heterogeneously dense. She missed her bone density scan so I will reschedule that. Her  appetite is good, and she has lost 2 1/2 pounds since her last visit.  She denies fever, chills or other signs of infection.  She denies nausea, vomiting, bowel issues, or abdominal pain.  She denies sore throat, cough, dyspnea, or chest pain.  REVIEW OF SYSTEMS:  Review of Systems  Constitutional: Negative.  Negative for appetite change, chills, fatigue, fever and unexpected weight change.  HENT:  Negative.    Eyes: Negative.   Respiratory: Negative.  Negative for chest tightness, cough, hemoptysis, shortness of breath and wheezing.   Cardiovascular: Negative.  Negative for chest pain, leg swelling and palpitations.  Gastrointestinal: Negative.  Negative for abdominal distention, abdominal pain, blood in stool, constipation, diarrhea, nausea and vomiting.  Endocrine: Negative.   Genitourinary: Negative.  Negative for difficulty urinating, dysuria, frequency and hematuria.   Musculoskeletal: Negative.  Negative for arthralgias, back pain, flank pain, gait problem and myalgias.  Skin: Negative.   Neurological: Negative.  Negative for dizziness,  extremity weakness, gait problem, headaches, light-headedness, numbness, seizures and speech difficulty.  Hematological: Negative.   Psychiatric/Behavioral: Negative.  Negative for depression and sleep disturbance. The patient is not nervous/anxious.      VITALS:  Blood pressure (!) 139/91, pulse 72, temperature 98 F (36.7 C), temperature source Oral, resp. rate 18, height 5' 3.5" (1.613 m), weight 207 lb 11.2 oz (94.2 kg), SpO2 97 %.  Wt Readings from Last 3 Encounters:  12/09/21 207 lb 11.2 oz (94.2 kg)  10/05/21 210 lb (95.3 kg)  06/09/21 206 lb 8 oz (93.7 kg)    Body mass index is 36.22 kg/m.  Performance status (ECOG): 0 - Asymptomatic  PHYSICAL EXAM:  Physical Exam Constitutional:      General: She is not in acute distress.    Appearance: Normal appearance. She is normal weight.  HENT:     Head: Normocephalic and atraumatic.  Eyes:     General: No scleral icterus.    Extraocular Movements: Extraocular movements intact.     Conjunctiva/sclera: Conjunctivae normal.     Pupils: Pupils are equal, round, and reactive to light.  Cardiovascular:     Rate and Rhythm: Normal rate and regular rhythm.     Pulses: Normal pulses.     Heart sounds: Normal heart sounds. No murmur heard.    No friction rub. No gallop.  Pulmonary:     Effort: Pulmonary effort is normal. No respiratory distress.     Breath sounds: Normal breath sounds.  Chest:     Comments: Well healed scar in the inferior aspect of the nipple areolar complex. Left breast has a scar in the upper inner quadrant which is also well healed but slightly nodular. Both breasts are without masses. Abdominal:     General: Bowel sounds are normal. There is no distension.     Palpations: Abdomen is soft. There is no hepatomegaly, splenomegaly or mass.     Tenderness: There is no abdominal tenderness.  Musculoskeletal:        General: Normal range of motion.     Cervical back: Normal range of motion and neck supple.     Right  lower leg: Edema (trace) present.     Left lower leg: Edema (trace) present.  Lymphadenopathy:     Cervical: No cervical adenopathy.  Skin:    General: Skin is warm and dry.     Comments: Well healed scars of the skin of the back but no suspicious lesions anywhere on the skin.  Neurological:     General: No focal deficit present.     Mental Status: She is alert and oriented to person, place, and time. Mental status is at baseline.  Psychiatric:        Mood and Affect: Mood normal.        Behavior: Behavior  normal.        Thought Content: Thought content normal.        Judgment: Judgment normal.    LABS:      Latest Ref Rng & Units 12/09/2021   12:00 AM 06/09/2021   12:00 AM 01/27/2021   12:00 AM  CBC  WBC  4.0     3.8     3.6      Hemoglobin 12.0 - 16.0 13.8     12.0     13.1      Hematocrit 36 - 46 41     37     39      Platelets 150 - 400 K/uL 195     188     186         This result is from an external source.      Latest Ref Rng & Units 12/09/2021   12:00 AM 06/09/2021   12:00 AM 01/27/2021   12:00 AM  CMP  BUN 4 - '21 15     11     17      '$ Creatinine 0.5 - 1.1 1.1     1.0     0.9      Sodium 137 - 147 142     140     143      Potassium 3.5 - 5.1 mEq/L 3.9     3.5     3.7      Chloride 99 - 108 109     106     107      CO2 13 - '22 23     26     28      '$ Calcium 8.7 - 10.7 9.4     8.6     9.0      Alkaline Phos 25 - 125 93     81     74      AST 13 - 35 44     32     37      ALT 7 - 35 U/L 44     25     27         This result is from an external source.   Lab Results  Component Value Date   LDH 194 (H) 12/09/2021   LDH 177 06/10/2021   LDH 180 01/27/2021    STUDIES:    EXAM: 06/07/21 DIGITAL SCREENING BILATERAL MAMMOGRAM WITH TOMOSYNTHESIS AND CAD  TECHNIQUE:  Bilateral screening digital craniocaudal and mediolateral oblique  mammograms were obtained. Bilateral screening digital breast  tomosynthesis was performed. The images were evaluated with   computer-aided detection.   COMPARISON: Previous exam(s).  ACR Breast Density Category c: The breast tissue is heterogeneously  dense, which may obscure small masses.  FINDINGS:  There are no findings suspicious for malignancy.   IMPRESSION:  No mammographic evidence of malignancy. A result letter of this  screening mammogram will be mailed directly to the patient.   RECOMMENDATION:  Screening mammogram in one year. (Code:SM-B-01Y)  BI-RADS CATEGORY 1: Negative.   Electronically Signed  By: Marin Olp M.D.  On: 06/07/2021 15:54   HISTORY:   Allergies:  Allergies  Allergen Reactions   Hydrocodone-Acetaminophen     Other reaction(s): Other (see comments)   Rituximab     Other reaction(s): Not available   Tape Itching    Skin redness.   Vicodin [Hydrocodone-Acetaminophen]     Current Medications: Current Outpatient Medications  Medication  Sig Dispense Refill   amLODipine (NORVASC) 2.5 MG tablet Take 2.5 mg by mouth daily.     CALCIUM-MAGNESIUM-ZINC PO Take 1 tablet by mouth daily.     cholecalciferol (VITAMIN D) 1000 units tablet Take 6,000 Units by mouth daily.     citalopram (CELEXA) 10 MG tablet Take 10 mg by mouth daily.     ezetimibe (ZETIA) 10 MG tablet Take 10 mg by mouth daily.     GLUTATHIONE PO Take by mouth. 2-3 times a day     L-Lysine 1000 MG TABS Take 1,000 mg by mouth daily.     NONFORMULARY OR COMPOUNDED ITEM Vitamin E once a day     pantoprazole (PROTONIX) 40 MG tablet Take 1 tablet (40 mg total) by mouth daily. 90 tablet 4   rosuvastatin (CRESTOR) 5 MG tablet Take 5 mg by mouth daily.      No current facility-administered medications for this visit.     ASSESSMENT & PLAN:   Assessment: 1. History of low-grade cutaneous lymphoma diagnosed in 2004 and treated with surgery and chemotherapy many years ago.   2. History of IA hormone receptor positive left breast cancer in August 2015 treated with surgery and radiation.  She did not tolerate  hormonal therapy.  She remains without evidence of recurrence.  She will be due for bilateral diagnostic mammogram again in March 2023.   3. 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     4. 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 This was treated with lumpectomy.  PET was otherwise negative, so no further therapy was recommended.   5. Subareolar right breast seroma, confirmed by mammogram and ultrasound.    6.  Recurrent lymphoma of the lower outer retroareolar right breast in March 2022.  There was 1 thickened right axillary lymph node, but this was not hypermetabolic on PET imaging and biopsy was negative.  There is no evidence of lymphoma elsewhere on PET. She was treated with radiation therapy with an excellent response.  Her current mammogram is clear.  Plan:     We will plan to see her back in 6 months with a bilateral screening mammogram. The patient understands the plans discussed today and is in agreement with them.  She knows to contact our office if she develops concerns prior to her next appointment.  I provided 15 minutes of face-to-face time during this this encounter and > 50% was spent counseling as documented under my assessment and plan.

## 2021-12-09 NOTE — Telephone Encounter (Signed)
Patient has seen a reflexology specialist and he recommend her to get her pancreas labs checked. She wants to know if you would order that or does she need to talk with her PCP?

## 2021-12-12 DIAGNOSIS — C8599 Non-Hodgkin lymphoma, unspecified, extranodal and solid organ sites: Secondary | ICD-10-CM | POA: Diagnosis not present

## 2021-12-12 LAB — LIPASE, BLOOD: Lipase: 31 U/L (ref 11–51)

## 2021-12-12 LAB — AMYLASE: Amylase: 60 U/L (ref 28–100)

## 2022-01-03 ENCOUNTER — Encounter: Payer: Self-pay | Admitting: Oncology

## 2022-06-20 ENCOUNTER — Other Ambulatory Visit: Payer: Self-pay | Admitting: Oncology

## 2022-06-20 DIAGNOSIS — C8219 Follicular lymphoma grade II, extranodal and solid organ sites: Secondary | ICD-10-CM

## 2022-06-20 NOTE — Progress Notes (Signed)
[facility]  [address] 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 female with a history of remote history of a low-grade cutaneous B-cell lymphoma, which was in remission for 10 years.  This was originally found in a skin lesion in 2004, biopsy of which was felt to represent lymphoproliferative tissue, but not definitively lymphoma.  A recurrent skin lesion of her back in 2006 was excised and pathology revealed a low-grade B-cell lymphoma.  She did not have evidence of disease elsewhere.  She received chemotherapy with CVP/rituximab.  She had an allergic reaction to rituximab with her first dose, so this was discontinued.  She had an excellent response to CVP alone.  This was completed in June 2006.     She was found to have stage IA (T1c N0 M0) hormone receptor positive left breast cancer in August 2015.  She was treated with lumpectomy.  Pathology revealed a 1.1 cm, grade 2, invasive ductal carcinoma with 3 negative nodes and a close deep margin.  Estrogen and progesterone receptors were positive and her 2 Neu negative.  Ki 67 was 28%.  Oncotype DX testing revealed a recurrence score of 17, which is in the low risk category, so chemotherapy was not recommended.  She received adjuvant radiation to the left breast, which was completed in November 2015. She was placed on hormonal therapy with tamoxifen in December, as she was still premenopausal.  Within a few months, she had severe toxicities of fatigue, lightheadedness, dyspnea, and paresthesias of her hands, so tamoxifen was discontinued.  The symptoms abated when she stopped the medication.  Rather than undergo Lupron, the patient underwent a bilateral  salpingo-oophorectomy in June 2016.  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  Staging scans did not reveal any other areas of disease, so we opted for observation only.  We did eventually try her on an aromatase inhibitor for her breast cancer, but she did not tolerate this either.  She previously had B12 deficiency treated with B12 injections, but these were discontinued and she does not take oral B12 supplement.  She has not had evidence of recurrent B12 deficiency.  She has also had abnormal liver transaminases and intermittent leukopenia.  Probable hepatic steatosis was noted on CT abdomen and pelvis, as well as abdominal ultrasound.  She had previous iron deficiency, which resolved.     Due to her personal and family history of cancer she did undergo testing for hereditary cancer syndromes with the [employer] Cancer Next gene panel.  This did not reveal any clinically significant mutation or variants of uncertain significance.     Bilateral mammogram in August 2017 did not reveal any evidence of malignancy.  Ultrasound of the left breast was done due to firmness of the lumpectomy site and revealed a 3.9 cm oval mass which was circumscribed with layering debris consistent with a complex seroma.  This was stable in size and felt to be benign.   Echocardiogram in March of 2020 revealed normal left ventricular size and function with an ejection fraction of 60-65%.  She was added to the schedule on May 26th, as she phoned reporting a new right  breast mass.  Right diagnostic mammogram and ultrasound were performed  The mammogram was negative, but the ultrasound revealed a 3 cm in diameter hypoechoic mass at 9 o'clock 1 cm from the nipple.  Biopsy was consistent with a lymphoproliferative disorder.  After reviewing the report of the stains and description, we felt this represented recurrence of her lymphoma.  PET scan revealed hypermetabolic activity in the right breast  with an SUV of 5.4, but no other areas of hyper metabolism.  She was treated with right lumpectomy in June. 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  Margins were clear.  As this was completely excised, no further therapy for the lymphoma was recommended.  She had COVID-19 in July 2020, but had very minimal symptoms.  Colonoscopy, as well as EGD were done in November 2020 by Dr. Chales Abrahams.   In February 2021, she had a palpable mass in the right breast under the nipple, which was mildly tender.  Diagnostic bilateral mammogram and bilateral breast ultrasound revealed a 2.8 cm retroareolar postoperative seroma containing a small amount of entrapped fat with overlying postsurgical scar tissue.  Normal appearing breast tissue throughout the upper outer left breast including dense glandular tissue  with a rounded peak, corresponding to the area of palpable nodularity and tenderness.  There was a no evidence of malignancy. In April 2021, she had mild anemia with a low normal ferritin. She was on an iron supplement for a time.  We recommended increasing iron in her diet.  In February 2022 , she was seen for abdominal complaints. CT chest, abdomen and pelvis was negative.    We recommended that she see Dr.Gupta for follow up.  Bilateral diagnostic mammogram and ultrasound in March revealed suspicious hypoechoic vascular material likely within ducts of the lower outer retroareolar right breast, as well as 1 thickened right axillary lymph node with a cortex measuring 5 mm.  Ultrasound guided biopsies of the right breast and right axillary node were obtained and pathology was  consistent with low grade lymphoma of the breast. The lymph node was negative. The histology was quite similar to her previous lymphomas.  PET scan revealed intense activity associated with the right breast mass consistent with recurrent lymphoma.  There was no evidence of hypermetabolic metastatic lymphadenopathy  elsewhere.  Spleen and bone marrow were normal.  There is moderate activity within the left humeral head favored to be posttraumatic or degenerative.  As her disease was localized, we recommended radiation to the right breast, which she completed in May. She is up-to-date on pelvic examination, as well as colonoscopy.    INTERVAL HISTORY:  Kerri Jones is here for routine follow up for recurrent low-grade lymphoma of the right breast and left breast cancer. Patient states that she feels well and has no complaints of pain. Her labs today are pending and I will add a B-12 to that. She is due for a bilateral diagnostic mammogram and I will schedule that for her.  I will see her back in 6 months with CBC, CMP, and LDH. She denies signs of infection such as sore throat, sinus drainage, cough, or urinary symptoms.  She denies fevers or recurrent chills. She denies pain. She denies nausea, vomiting, chest pain, dyspnea or cough. Her appetite is good and her weight has decreased 1 pounds over last 6 months .  REVIEW OF SYSTEMS:  Review of Systems  Constitutional: Negative.  Negative for appetite change, chills, diaphoresis, fatigue, fever and unexpected weight  change.  HENT:  Negative.  Negative for hearing loss, lump/mass, mouth sores, nosebleeds, sore throat, tinnitus, trouble swallowing and voice change.   Eyes: Negative.  Negative for eye problems and icterus.  Respiratory: Negative.  Negative for chest tightness, cough, hemoptysis, shortness of breath and wheezing.   Cardiovascular: Negative.  Negative for chest pain, leg swelling and palpitations.  Gastrointestinal: Negative.  Negative for abdominal distention, abdominal pain, blood in stool, constipation, diarrhea, nausea, rectal pain and vomiting.  Endocrine: Negative.   Genitourinary: Negative.  Negative for bladder incontinence, difficulty urinating, dyspareunia, dysuria, frequency, hematuria, menstrual problem, nocturia, pelvic pain, vaginal bleeding and  vaginal discharge.   Musculoskeletal: Negative.  Negative for arthralgias, back pain, flank pain, gait problem, myalgias, neck pain and neck stiffness.  Skin: Negative.  Negative for itching, rash and wound.  Neurological: Negative.  Negative for dizziness, extremity weakness, gait problem, headaches, light-headedness, numbness, seizures and speech difficulty.  Hematological: Negative.  Negative for adenopathy. Does not bruise/bleed easily.  Psychiatric/Behavioral: Negative.  Negative for confusion, decreased concentration, depression, sleep disturbance and suicidal ideas. The patient is not nervous/anxious.      VITALS:  Blood pressure 126/80, pulse 62, temperature 98 F (36.7 C), temperature source Oral, resp. rate 18, height 5' 3.5" (1.613 m), weight 206 lb 8 oz (93.7 kg), SpO2 97 %.  Wt Readings from Last 3 Encounters:  06/21/22 206 lb 8 oz (93.7 kg)  12/09/21 207 lb 11.2 oz (94.2 kg)  10/05/21 210 lb (95.3 kg)    Body mass index is 36.01 kg/m.  Performance status (ECOG): 0 - Asymptomatic  PHYSICAL EXAM:  Physical Exam Vitals and nursing note reviewed.  Constitutional:      General: She is not in acute distress.    Appearance: Normal appearance. She is normal weight. She is not ill-appearing, toxic-appearing or diaphoretic.  HENT:     Head: Normocephalic and atraumatic.     Right Ear: Tympanic membrane, ear canal and external ear normal. There is no impacted cerumen.     Left Ear: Tympanic membrane, ear canal and external ear normal. There is no impacted cerumen.     Nose: Nose normal. No congestion or rhinorrhea.     Mouth/Throat:     Mouth: Mucous membranes are moist.     Pharynx: Oropharynx is clear. No oropharyngeal exudate or posterior oropharyngeal erythema.  Eyes:     General: No scleral icterus.       Right eye: No discharge.        Left eye: No discharge.     Extraocular Movements: Extraocular movements intact.     Conjunctiva/sclera: Conjunctivae normal.      Pupils: Pupils are equal, round, and reactive to light.  Neck:     Vascular: No carotid bruit.  Cardiovascular:     Rate and Rhythm: Normal rate and regular rhythm.     Pulses: Normal pulses.     Heart sounds: Normal heart sounds. No murmur heard.    No friction rub. No gallop.  Pulmonary:     Effort: Pulmonary effort is normal. No respiratory distress.     Breath sounds: Normal breath sounds. No stridor. No wheezing, rhonchi or rales.  Chest:     Chest wall: No tenderness.     Comments: Well healed scar in the upper inner quadrant of the left breast. Well healed axillary incision. Fading scar along the lower outer quadrant of the right breast along the areolar edge.   Abdominal:     General: Bowel sounds  are normal. There is no distension.     Palpations: Abdomen is soft. There is no hepatomegaly, splenomegaly or mass.     Tenderness: There is no abdominal tenderness. There is no right CVA tenderness, left CVA tenderness, guarding or rebound.     Hernia: No hernia is present.  Musculoskeletal:        General: No swelling, tenderness, deformity or signs of injury. Normal range of motion.     Cervical back: Normal range of motion and neck supple. No rigidity or tenderness.     Right lower leg: No edema.     Left lower leg: No edema.  Lymphadenopathy:     Cervical: No cervical adenopathy.  Skin:    General: Skin is warm and dry.     Coloration: Skin is not jaundiced or pale.     Findings: No bruising, erythema, lesion or rash.     Comments: A couple faint scar in the middle of her upper back.  Keratosis on her back right calf.   Neurological:     General: No focal deficit present.     Mental Status: She is alert and oriented to person, place, and time. Mental status is at baseline.     Cranial Nerves: No cranial nerve deficit.     Sensory: No sensory deficit.     Motor: No weakness.     Coordination: Coordination normal.     Gait: Gait normal.     Deep Tendon Reflexes:  Reflexes normal.  Psychiatric:        Mood and Affect: Mood normal.        Behavior: Behavior normal.        Thought Content: Thought content normal.        Judgment: Judgment normal.    LABS:      Latest Ref Rng & Units 06/21/2022    3:06 PM 12/09/2021   12:00 AM 06/09/2021   12:00 AM  CBC  WBC 4.0 - 10.5 K/uL 4.0  4.0     3.8      Hemoglobin 12.0 - 15.0 g/dL 16.1  09.6     04.5      Hematocrit 36.0 - 46.0 % 37.3  41     37      Platelets 150 - 400 K/uL 183  195     188         This result is from an external source.      Latest Ref Rng & Units 06/21/2022    3:06 PM 12/09/2021   12:00 AM 06/09/2021   12:00 AM  CMP  Glucose 70 - 99 mg/dL 94     BUN 6 - 20 mg/dL 14  15     11       Creatinine 0.44 - 1.00 mg/dL 4.09  1.1     1.0      Sodium 135 - 145 mmol/L 138  142     140      Potassium 3.5 - 5.1 mmol/L 3.6  3.9     3.5      Chloride 98 - 111 mmol/L 107  109     106      CO2 22 - 32 mmol/L 24  23     26       Calcium 8.9 - 10.3 mg/dL 8.7  9.4     8.6      Total Protein 6.5 - 8.1 g/dL 7.0     Total Bilirubin 0.3 - 1.2 mg/dL  0.8     Alkaline Phos 38 - 126 U/L 76  93     81      AST 15 - 41 U/L 25  44     32      ALT 0 - 44 U/L 28  44     25         This result is from an external source.   Lab Results  Component Value Date   LDH 194 (H) 12/09/2021   LDH 177 06/10/2021   LDH 180 01/27/2021   Component Ref Range & Units 6 mo ago  Amylase 28 - 100 U/L 60   Component Ref Range & Units 6 mo ago  Lipase 11 - 51 U/L 31    STUDIES:    HISTORY:   Allergies:  Allergies  Allergen Reactions   Hydrocodone-Acetaminophen     Other reaction(s): Other (see comments)   Rituximab     Other reaction(s): Not available   Tape Itching    Skin redness.   Vicodin [Hydrocodone-Acetaminophen]     Current Medications: Current Outpatient Medications  Medication Sig Dispense Refill   amLODipine (NORVASC) 5 MG tablet Take 5 mg by mouth daily.     fluticasone (FLONASE) 50  MCG/ACT nasal spray Place 1 spray into both nostrils daily.     losartan (COZAAR) 25 MG tablet Take 25 mg by mouth daily.     Naproxen Sodium 220 MG CAPS Take by mouth.     valACYclovir (VALTREX) 1000 MG tablet Take 2,000 mg by mouth 2 (two) times daily.     CALCIUM-MAGNESIUM-ZINC PO Take 1 tablet by mouth daily.     cholecalciferol (VITAMIN D) 1000 units tablet Take 6,000 Units by mouth daily.     citalopram (CELEXA) 10 MG tablet Take 10 mg by mouth daily.     ezetimibe (ZETIA) 10 MG tablet Take 10 mg by mouth daily.     GLUTATHIONE PO Take by mouth. 2-3 times a day     L-Lysine 1000 MG TABS Take 1,000 mg by mouth daily.     NONFORMULARY OR COMPOUNDED ITEM Vitamin E once a day     pantoprazole (PROTONIX) 40 MG tablet Take 1 tablet (40 mg total) by mouth daily. 90 tablet 4   rosuvastatin (CRESTOR) 5 MG tablet Take 5 mg by mouth daily.      No current facility-administered medications for this visit.     ASSESSMENT & PLAN:  Assessment: 1. History of low-grade cutaneous lymphoma diagnosed in 2004 and treated with surgery and chemotherapy many years ago.   2. History of IA hormone receptor positive left breast cancer in August 2015 treated with surgery and radiation.  She did not tolerate hormonal therapy.  She remains without evidence of recurrence.  She will be due for bilateral diagnostic mammogram again in March 2023.   3. 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     4. 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 This was treated with lumpectomy.  PET was otherwise negative, so no further therapy was recommended.   5. Subareolar right breast seroma, confirmed by mammogram and ultrasound.    6.  Recurrent lymphoma of the lower outer retroareolar right breast in March 2022.  There was 1 thickened right axillary lymph node, but this was not hypermetabolic on PET imaging and biopsy was negative.  There is no evidence of  lymphoma elsewhere on PET. She was treated  with radiation therapy with an excellent response.  Her current mammogram is clear.  Plan:     Her labs today are pending and I will add a B-12 to that, since it was last checked a year ago. She is due for a bilateral diagnostic mammogram and I will schedule that for her.  I will see her back in 6 months with CBC, CMP, and LDH. The patient understands the plans discussed today and is in agreement with them.  She knows to contact our office if she develops concerns prior to her next appointment.  I provided 13 minutes of face-to-face time during this this encounter and > 50% was spent counseling as documented under my assessment and plan.   I,Jasmine M Lassiter,acting as a scribe for Dellia Beckwith, MD.,have documented all relevant documentation on the behalf of Dellia Beckwith, MD,as directed by  Dellia Beckwith, MD while in the presence of Dellia Beckwith, MD.

## 2022-06-21 ENCOUNTER — Other Ambulatory Visit: Payer: Self-pay | Admitting: Oncology

## 2022-06-21 ENCOUNTER — Inpatient Hospital Stay: Payer: BC Managed Care – PPO | Attending: Oncology | Admitting: Oncology

## 2022-06-21 ENCOUNTER — Inpatient Hospital Stay: Payer: BC Managed Care – PPO

## 2022-06-21 ENCOUNTER — Encounter: Payer: Self-pay | Admitting: Oncology

## 2022-06-21 VITALS — BP 126/80 | HR 62 | Temp 98.0°F | Resp 18 | Ht 63.5 in | Wt 206.5 lb

## 2022-06-21 DIAGNOSIS — C8219 Follicular lymphoma grade II, extranodal and solid organ sites: Secondary | ICD-10-CM

## 2022-06-21 DIAGNOSIS — C8200 Follicular lymphoma grade I, unspecified site: Secondary | ICD-10-CM | POA: Diagnosis not present

## 2022-06-21 DIAGNOSIS — C50912 Malignant neoplasm of unspecified site of left female breast: Secondary | ICD-10-CM

## 2022-06-21 DIAGNOSIS — Z170 Estrogen receptor positive status [ER+]: Secondary | ICD-10-CM | POA: Diagnosis not present

## 2022-06-21 DIAGNOSIS — D519 Vitamin B12 deficiency anemia, unspecified: Secondary | ICD-10-CM

## 2022-06-21 DIAGNOSIS — E538 Deficiency of other specified B group vitamins: Secondary | ICD-10-CM | POA: Insufficient documentation

## 2022-06-21 DIAGNOSIS — D72819 Decreased white blood cell count, unspecified: Secondary | ICD-10-CM

## 2022-06-21 LAB — CMP (CANCER CENTER ONLY)
ALT: 28 U/L (ref 0–44)
AST: 25 U/L (ref 15–41)
Albumin: 3.9 g/dL (ref 3.5–5.0)
Alkaline Phosphatase: 76 U/L (ref 38–126)
Anion gap: 7 (ref 5–15)
BUN: 14 mg/dL (ref 6–20)
CO2: 24 mmol/L (ref 22–32)
Calcium: 8.7 mg/dL — ABNORMAL LOW (ref 8.9–10.3)
Chloride: 107 mmol/L (ref 98–111)
Creatinine: 1.05 mg/dL — ABNORMAL HIGH (ref 0.44–1.00)
GFR, Estimated: >60 mL/min (ref >60)
Glucose, Bld: 94 mg/dL (ref 70–99)
Potassium: 3.6 mmol/L (ref 3.5–5.1)
Sodium: 138 mmol/L (ref 135–145)
Total Bilirubin: 0.8 mg/dL (ref 0.3–1.2)
Total Protein: 7 g/dL (ref 6.5–8.1)

## 2022-06-21 LAB — CBC WITH DIFFERENTIAL (CANCER CENTER ONLY)
Abs Immature Granulocytes: 0.01 10*3/uL (ref 0.00–0.07)
Basophils Absolute: 0 10*3/uL (ref 0.0–0.1)
Basophils Relative: 1 %
Eosinophils Absolute: 0.1 10*3/uL (ref 0.0–0.5)
Eosinophils Relative: 3 %
HCT: 37.3 % (ref 36.0–46.0)
Hemoglobin: 12.5 g/dL (ref 12.0–15.0)
Immature Granulocytes: 0 %
Lymphocytes Relative: 29 %
Lymphs Abs: 1.2 10*3/uL (ref 0.7–4.0)
MCH: 27.5 pg (ref 26.0–34.0)
MCHC: 33.5 g/dL (ref 30.0–36.0)
MCV: 82.2 fL (ref 80.0–100.0)
Monocytes Absolute: 0.4 10*3/uL (ref 0.1–1.0)
Monocytes Relative: 9 %
Neutro Abs: 2.4 10*3/uL (ref 1.7–7.7)
Neutrophils Relative %: 58 %
Platelet Count: 183 10*3/uL (ref 150–400)
RBC: 4.54 MIL/uL (ref 3.87–5.11)
RDW: 13.6 % (ref 11.5–15.5)
WBC Count: 4 10*3/uL (ref 4.0–10.5)
nRBC: 0 % (ref 0.0–0.2)

## 2022-06-21 LAB — VITAMIN B12: Vitamin B-12: 367 pg/mL (ref 180–914)

## 2022-06-23 LAB — HM MAMMOGRAPHY

## 2022-07-05 ENCOUNTER — Encounter: Payer: Self-pay | Admitting: Oncology

## 2022-07-05 ENCOUNTER — Telehealth: Payer: Self-pay

## 2022-07-05 NOTE — Telephone Encounter (Signed)
-----   Message from Dellia Beckwith, MD sent at 07/05/2022  1:46 PM EDT ----- Regarding: FW: mammo, DEXA I see mammo in chart now, I don't think we ever called her, let her know it looks good ----- Message ----- From: Thomasena Edis Sent: 07/05/2022   9:32 AM EDT To: Dellia Beckwith, MD Subject: RE: Arlee Muslim, DEXA                                Mammo scheduled on 06/23/22.Dexa scan 08/11/22 ----- Message ----- From: Dellia Beckwith, MD Sent: 07/05/2022   9:21 AM EDT To: Jeannette Corpus, LPN; Thomasena Edis Subject: mammo, DEXA                                    Has she had her mammo scheduled yet? Did she ever have her DEXA scan?

## 2022-07-05 NOTE — Telephone Encounter (Signed)
Attempted to contact patient. No answer and no VM.  

## 2022-07-05 NOTE — Telephone Encounter (Signed)
-----   Message from Christine H McCarty, MD sent at 07/05/2022  1:46 PM EDT ----- Regarding: FW: mammo, DEXA I see mammo in chart now, I don't think we ever called her, let her know it looks good ----- Message ----- From: Bullock, Denise Sent: 07/05/2022   9:32 AM EDT To: Christine H McCarty, MD Subject: RE: mammo, DEXA                                Mammo scheduled on 06/23/22.Dexa scan 08/11/22 ----- Message ----- From: McCarty, Christine H, MD Sent: 07/05/2022   9:21 AM EDT To: Arthi Mcdonald M Mirra Basilio, LPN; Denise Bullock Subject: mammo, DEXA                                    Has she had her mammo scheduled yet? Did she ever have her DEXA scan?    

## 2022-07-05 NOTE — Telephone Encounter (Signed)
Bone density scheduled for 08/11/22 at [facility]

## 2022-08-11 LAB — HM DEXA SCAN

## 2022-08-14 ENCOUNTER — Telehealth: Payer: Self-pay | Admitting: Gastroenterology

## 2022-08-14 MED ORDER — PANTOPRAZOLE SODIUM 40 MG PO TBEC: 40.0000 mg | DELAYED_RELEASE_TABLET | Freq: Every day | ORAL | 0 refills | Status: DC

## 2022-08-14 NOTE — Telephone Encounter (Signed)
Inbound call from patient requesting a refill for Protonix. Patient was scheduled for an appointment with Dr. Chales Abrahams on 9/9 at 2:10. Please advise.

## 2022-08-14 NOTE — Telephone Encounter (Signed)
Done to pharmacy on file

## 2022-08-22 ENCOUNTER — Encounter: Payer: Self-pay | Admitting: *Deleted

## 2022-11-06 ENCOUNTER — Encounter: Payer: Self-pay | Admitting: Gastroenterology

## 2022-11-06 ENCOUNTER — Ambulatory Visit: Payer: BC Managed Care – PPO | Admitting: Gastroenterology

## 2022-11-06 VITALS — BP 136/80 | HR 75 | Ht 63.5 in | Wt 206.0 lb

## 2022-11-06 DIAGNOSIS — K449 Diaphragmatic hernia without obstruction or gangrene: Secondary | ICD-10-CM | POA: Diagnosis not present

## 2022-11-06 DIAGNOSIS — K219 Gastro-esophageal reflux disease without esophagitis: Secondary | ICD-10-CM

## 2022-11-06 MED ORDER — PANTOPRAZOLE SODIUM 40 MG PO TBEC: 40.0000 mg | DELAYED_RELEASE_TABLET | Freq: Every day | ORAL | 3 refills | Status: DC

## 2022-11-06 NOTE — Progress Notes (Signed)
Chief Complaint: FU  Referring Provider:  Abner Greenspan, MD      ASSESSMENT AND PLAN;   #1. GERD with small HH  #2. IDA (resolved). H/O B12 deficiency. 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 H/O lymphoma right ovary 2016 (treated with surgery alone). H/O right breast follicular lymphoma s/p lumpectomy/XRT.   #4. 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 Neg CT AP 04/29/2020  Plan: - Continue protonix 40mg  po QD #90, 4 RF - Call if problems. - Rpt screening colon Nov 2030. - FU Dr Gilman Buttner.     HPI:    Kerri Jones is a 57 y.o. female  For FU visit.  Here for medication refill.  Doing good with protonix Had breakthru symptoms last month but better now  Was doing very well with Dexilant.  Unfortunately, insurance stopped covering.  She has been switched to Protonix which helps but not as much.  We will try to get her from drug company.  Seen by Dr. Gilman Buttner. Had R breast palpable lesion.  This was followed by mammography and then biopsy revealing recurrence of lymphoma, confirmed on PET scan.  She underwent surgery/radiation.  Negative recent mammography.  She has appointment with Dr. Gilman Buttner in October 2024.    No significant GI complaints.  Constipation is better.  Abdo pain is much better as well.  No rectal bleeding.  No weight loss.  Wt Readings from Last 3 Encounters:  11/06/22 206 lb (93.4 kg)  06/21/22 206 lb 8 oz (93.7 kg)  12/09/21 207 lb 11.2 oz (94.2 kg)     Past GI workup: -Negative EGD/colonoscopy November 2020. Rpt colon in 10 yrs. -Had CT chest Abdo/pelvis on 04/29/2020 which was unremarkable  Past Medical History:  Diagnosis Date   Breast cancer (HCC) 10/16/2013   Cancer (HCC) 2004/2006/2020   Low B Cell lymphoma   Chronic constipation    Family history of colon cancer    Family  history of prostate cancer    GERD (gastroesophageal reflux disease)    HTN (hypertension)    Hypercholesteremia    Personal history of radiation therapy    Plantar fasciitis    Vitamin B 12 deficiency     Past Surgical History:  Procedure Laterality Date   APPENDECTOMY     BREAST LUMPECTOMY Left 2015   BREAST LUMPECTOMY Right 08/26/2018   CESAREAN SECTION     CHOLECYSTECTOMY     COLONOSCOPY  08/08/2013   Colon polyp-status post polypectomy. Small internal hemorrhoids. Otherwise normal colonoscopy.    ESOPHAGOGASTRODUODENOSCOPY  06/24/2012   Mild gastritis. Healed esophageal erosions. Otherwise normal EGD.    OOPHORECTOMY     SKIN BIOPSY     multiple   Throat Polyp removed     TONSILLECTOMY      Family History  Problem Relation Age of Onset   Colon polyps Mother    Lung cancer Father 47   Cancer Maternal Aunt 68       GIST   Stomach cancer Maternal Aunt    Colon cancer Maternal Uncle 4       dx twice at age 14   Lung cancer Maternal Grandmother 41   Prostate cancer Maternal Grandfather 64   Esophageal cancer Neg Hx    Rectal cancer Neg Hx     Social History   Tobacco Use   Smoking status:  Never   Smokeless tobacco: Never  Vaping Use   Vaping status: Never Used  Substance Use Topics   Alcohol use: No   Drug use: Not Currently    Current Outpatient Medications  Medication Sig Dispense Refill   amLODipine (NORVASC) 5 MG tablet Take 5 mg by mouth daily.     CALCIUM-MAGNESIUM-ZINC PO Take 1 tablet by mouth daily.     cholecalciferol (VITAMIN D) 1000 units tablet Take 6,000 Units by mouth daily.     citalopram (CELEXA) 10 MG tablet Take 10 mg by mouth daily.     ezetimibe (ZETIA) 10 MG tablet Take 10 mg by mouth daily.     GLUTATHIONE PO Take by mouth. 2-3 times a day     L-Lysine 1000 MG TABS Take 1,000 mg by mouth daily.     losartan (COZAAR) 25 MG tablet Take 25 mg by mouth daily.     Naproxen Sodium 220 MG CAPS Take by mouth.     NONFORMULARY OR  COMPOUNDED ITEM Vitamin E once a day     pantoprazole (PROTONIX) 40 MG tablet Take 1 tablet (40 mg total) by mouth daily. 90 tablet 0   rosuvastatin (CRESTOR) 5 MG tablet Take 5 mg by mouth daily.      valACYclovir (VALTREX) 1000 MG tablet Take 2,000 mg by mouth 2 (two) times daily.     fluticasone (FLONASE) 50 MCG/ACT nasal spray Place 1 spray into both nostrils daily. (Patient not taking: Reported on 11/06/2022)     No current facility-administered medications for this visit.    Allergies  Allergen Reactions   Hydrocodone-Acetaminophen     Other reaction(s): Other (see comments)   Rituximab     Other reaction(s): Not available   Tape Itching    Skin redness.   Vicodin [Hydrocodone-Acetaminophen]     Review of Systems:  neg     Physical Exam:    BP 136/80   Pulse 75   Ht 5' 3.5" (1.613 m)   Wt 206 lb (93.4 kg)   BMI 35.92 kg/m  Filed Weights   11/06/22 1408  Weight: 206 lb (93.4 kg)   Constitutional:  Well-developed, in no acute distress. Psychiatric: Normal mood and affect. Behavior is normal. Pulmonary/chest: Effort normal and breath sounds normal. No wheezing, rales or rhonchi. Abdominal: Soft, nondistended. Nontender. Bowel sounds active throughout. There are no masses palpable. No hepatomegaly.  Reviewed Dr. Lavonda Jumbo last note and labs.    Edman Circle, MD 11/06/2022, 2:17 PM  Cc: Abner Greenspan, MD  Dr Gery Pray.

## 2022-11-06 NOTE — Patient Instructions (Addendum)
_______________________________________________________  If your blood pressure at your visit was 140/90 or greater, please contact your primary care physician to follow up on this.  _______________________________________________________  If you are age 58 or older, your body mass index should be between 23-30. Your Body mass index is 35.92 kg/m. If this is out of the aforementioned range listed, please consider follow up with your Primary Care Provider.  If you are age 57 or younger, your body mass index should be between 19-25. Your Body mass index is 35.92 kg/m. If this is out of the aformentioned range listed, please consider follow up with your Primary Care Provider.   ________________________________________________________  The [facility] providers would like to encourage you to use [facility] to communicate with providers for non-urgent requests or questions.  Due to long hold times on the telephone, sending your provider a message by [facility] may be a faster and more efficient way to get a response.  Please allow 48 business hours for a response.  Please remember that this is for non-urgent requests.  _______________________________________________________  Continue Protonix 40mg   You will be due for a recall colonoscopy in 12-2028. We will send you a reminder in the mail when it gets closer to that time.  Follow up with Dr. Gilman Buttner   It was a pleasure to see you today!  Thank you for trusting me with your gastrointestinal care!

## 2022-11-14 LAB — COMPREHENSIVE METABOLIC PANEL: EGFR: 58

## 2022-12-19 NOTE — Progress Notes (Signed)
[facility]  [address] 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 female with a history of remote history of a low-grade cutaneous B-cell lymphoma, which was in remission for 10 years.  This was originally found in a skin lesion in 2004, biopsy of which was felt to represent lymphoproliferative tissue, but not definitively lymphoma.  A recurrent skin lesion of her back in 2006 was excised and pathology revealed a low-grade B-cell lymphoma.  She did not have evidence of disease elsewhere.  She received chemotherapy with CVP/rituximab.  She had an allergic reaction to rituximab with her first dose, so this was discontinued.  She had an excellent response to CVP alone.  This was completed in June 2006.     She was found to have stage IA (T1c N0 M0) hormone receptor positive left breast cancer in August 2015.  She was treated with lumpectomy.  Pathology revealed a 1.1 cm, grade 2, invasive ductal carcinoma with 3 negative nodes and a close deep margin.  Estrogen and progesterone receptors were positive and her 2 Neu negative.  Ki 67 was 28%.  Oncotype DX testing revealed a recurrence score of 17, which is in the low risk category, so chemotherapy was not recommended.  She received adjuvant radiation to the left breast, which was completed in November 2015. She was placed on hormonal therapy with tamoxifen in December, as she was still premenopausal.  Within a few months, she had severe toxicities of fatigue, lightheadedness, dyspnea, and paresthesias of her hands, so tamoxifen was discontinued.  The symptoms abated when she stopped the medication.  Rather than undergo Lupron, the patient underwent a  bilateral salpingo-oophorectomy in June 2016.  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  Staging scans did not reveal any other areas of disease, so we opted for observation only.  We did eventually try her on an aromatase inhibitor for her breast cancer, but she did not tolerate this either.  She previously had B12 deficiency treated with B12 injections, but these were discontinued and she does not take oral B12 supplement.  She has not had evidence of recurrent B12 deficiency.  She has also had abnormal liver transaminases and intermittent leukopenia.  Probable hepatic steatosis was noted on CT abdomen and pelvis, as well as abdominal ultrasound.  She had previous iron deficiency, which resolved.     Due to her personal and family history of cancer she did undergo testing for hereditary cancer syndromes with the [employer] Cancer Next gene panel.  This did not reveal any clinically significant mutation or variants of uncertain significance.     Bilateral mammogram in August 2017 did not reveal any evidence of malignancy.  Ultrasound of the left breast was done due to firmness of the lumpectomy site and revealed a 3.9 cm oval mass which was circumscribed with layering debris consistent with a complex seroma.  This was stable in size and felt to be benign.   Echocardiogram in March of 2020 revealed normal left ventricular size and function with an ejection fraction of 60-65%.  She was added to the schedule on May 26th, as she phoned reporting a new right  breast mass.  Right diagnostic mammogram and ultrasound were performed  The mammogram was negative, but the ultrasound revealed a 3 cm in diameter hypoechoic mass at 9 o'clock 1 cm from the nipple.  Biopsy was consistent with a lymphoproliferative disorder.  After reviewing the report of the stains and description, we felt this represented recurrence of her lymphoma.  PET scan revealed hypermetabolic activity in the  right breast with an SUV of 5.4, but no other areas of hyper metabolism.  She was treated with right lumpectomy in June. 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  Margins were clear.  As this was completely excised, no further therapy for the lymphoma was recommended.  She had COVID-19 in July 2020, but had very minimal symptoms.  Colonoscopy, as well as EGD were done in November 2020 by Dr. Chales Abrahams.   In February 2021, she had a palpable mass in the right breast under the nipple, which was mildly tender.  Diagnostic bilateral mammogram and bilateral breast ultrasound revealed a 2.8 cm retroareolar postoperative seroma containing a small amount of entrapped fat with overlying postsurgical scar tissue.  Normal appearing breast tissue throughout the upper outer left breast including dense glandular tissue  with a rounded peak, corresponding to the area of palpable nodularity and tenderness.  There was a no evidence of malignancy. In April 2021, she had mild anemia with a low normal ferritin. She was on an iron supplement for a time.  We recommended increasing iron in her diet.  In February 2022 , she was seen for abdominal complaints. CT chest, abdomen and pelvis was negative.    We recommended that she see Dr.Gupta for follow up.  Bilateral diagnostic mammogram and ultrasound in March revealed suspicious hypoechoic vascular material likely within ducts of the lower outer retroareolar right breast, as well as 1 thickened right axillary lymph node with a cortex measuring 5 mm.  Ultrasound guided biopsies of the right breast and right axillary node were obtained and pathology was  consistent with low grade lymphoma of the breast. The lymph node was negative. The histology was quite similar to her previous lymphomas.  PET scan revealed intense activity associated with the right breast mass consistent with recurrent lymphoma.  There was no evidence of hypermetabolic metastatic  lymphadenopathy elsewhere.  Spleen and bone marrow were normal.  There is moderate activity within the left humeral head favored to be posttraumatic or degenerative.  As her disease was localized, we recommended radiation to the right breast, which she completed in May. She is up-to-date on pelvic examination, as well as colonoscopy.    INTERVAL HISTORY:  Kerri Jones is here for routine follow up for recurrent low-grade lymphoma of multiple sites and left breast cancer. Patient states that she feels well and has no complaints of pain. On 11/20/2022 she had a spot on her chest removed and she hasn't heard back from Dermatology. I will contact them and request the pathology report. Zulie had labs done recently and was told she had stage 3 kidney disease due to a elevated creatinine of 1.13 with a eGFR of 56. I informed her that the biggest risks to her kidneys is from the Naproxen Sodium/NSAIDS and she takes this occasionally. I informed her that taking tylenol would be a safer option. She informed me that she has also been taking toradol and I recommended that we stop that. She also gets "immune" infusions from [facility] in [place] which is largely multiple vitamins  and glutathione but she wasn't clear on all of the ingredients so I asked her to find out and got her permission to inform her PCP about the ingredients to be sure this is not harming her kidney functions. Her labs today are pending. Delphina had a bone density scan done on 08/11/2022 that showed osteopenia of the right hip and normal bone density in her spine and dual femur. I recommended she take calcium and vitamin D supplements and have a repeat bone density scan in 96yrs. She will be due for repeat mammogram in April, 2025. She will also see her PCP in March and I will see her back in 8 months with CBC, CMP, LDH, and bilateral screening mammogram. She denies signs of infection such as sore throat, sinus drainage, cough, or urinary symptoms.   She denies fevers or recurrent chills. She denies pain. She denies nausea, vomiting, chest pain, dyspnea or cough. Her appetite is good and her  weight has decreased 3 pounds over last month .  REVIEW OF SYSTEMS:  Review of Systems  Constitutional: Negative.  Negative for appetite change, chills, diaphoresis, fatigue, fever and unexpected weight change.  HENT:  Negative.  Negative for hearing loss, lump/mass, mouth sores, nosebleeds, sore throat, tinnitus, trouble swallowing and voice change.   Eyes: Negative.  Negative for eye problems and icterus.  Respiratory: Negative.  Negative for chest tightness, cough, hemoptysis, shortness of breath and wheezing.   Cardiovascular: Negative.  Negative for chest pain, leg swelling and palpitations.  Gastrointestinal: Negative.  Negative for abdominal distention, abdominal pain, blood in stool, constipation, diarrhea, nausea, rectal pain and vomiting.  Endocrine: Negative.   Genitourinary: Negative.  Negative for bladder incontinence, difficulty urinating, dyspareunia, dysuria, frequency, hematuria, menstrual problem, nocturia, pelvic pain, vaginal bleeding and vaginal discharge.   Musculoskeletal: Negative.  Negative for arthralgias, back pain, flank pain, gait problem, myalgias, neck pain and neck stiffness.  Skin: Negative.  Negative for itching, rash and wound.  Neurological: Negative.  Negative for dizziness, extremity weakness, gait problem, headaches, light-headedness, numbness, seizures and speech difficulty.  Hematological: Negative.  Negative for adenopathy. Does not bruise/bleed easily.  Psychiatric/Behavioral: Negative.  Negative for confusion, decreased concentration, depression, sleep disturbance and suicidal ideas. The patient is not nervous/anxious.      VITALS:  Blood pressure 119/84, pulse 73, temperature (!) 97.5 F (36.4 C), temperature source Oral, resp. rate 18, height 5' 3.5" (1.613 m), weight 203 lb 14.4 oz (92.5 kg), SpO2 97%.   Wt Readings from Last 3 Encounters:  12/21/22 203 lb 14.4 oz (92.5 kg)  11/06/22 206 lb (93.4 kg)  06/21/22 206 lb 8 oz (93.7 kg)    Body mass index is 35.55 kg/m.  Performance status (ECOG): 0 - Asymptomatic  PHYSICAL EXAM:  Physical Exam Vitals and nursing note reviewed.  Constitutional:      General: She is not in acute distress.    Appearance: Normal appearance. She is normal weight. She is not ill-appearing, toxic-appearing or diaphoretic.  HENT:     Head: Normocephalic and atraumatic.     Right Ear: Tympanic membrane, ear canal and external ear normal. There is no impacted cerumen.     Left Ear: Tympanic membrane, ear canal and external ear normal. There is no impacted cerumen.     Nose: Nose normal. No congestion or rhinorrhea.     Mouth/Throat:     Mouth: Mucous membranes are moist.     Pharynx: Oropharynx is clear. No oropharyngeal exudate or posterior oropharyngeal erythema.  Eyes:     General: No scleral icterus.       Right eye: No discharge.        Left eye: No discharge.     Extraocular Movements: Extraocular movements intact.     Conjunctiva/sclera: Conjunctivae normal.     Pupils: Pupils are equal, round, and reactive to light.  Neck:     Vascular: No carotid bruit.  Cardiovascular:     Rate and Rhythm: Normal rate and regular rhythm.     Pulses: Normal pulses.     Heart sounds: Normal heart sounds. No murmur heard.    No friction rub. No gallop.  Pulmonary:     Effort: Pulmonary effort is normal. No respiratory distress.     Breath sounds: Normal breath sounds. No stridor. No wheezing, rhonchi or rales.  Chest:     Chest wall: No tenderness.     Comments: Slight erythematous small area in the left upper chest anterior which is healing. Right breast has a well healed scar in the inferior areolar complex She has a healed scar in the left upper chest anterior Well healed scar in the upper inner quadrant of the left breast Well healed left axillary  scar No masses in either breast.  Abdominal:     General: Bowel sounds are normal. There is no distension.     Palpations: Abdomen is soft. There is no hepatomegaly, splenomegaly or mass.     Tenderness: There is no abdominal tenderness. There is no right CVA tenderness, left CVA tenderness, guarding or rebound.     Hernia: No hernia is present.  Musculoskeletal:        General: No swelling, tenderness, deformity or signs of injury. Normal range of motion.     Cervical back: Normal range of motion and neck supple. No rigidity or tenderness.     Right lower leg: No edema.     Left lower leg: No edema.     Comments: The scars of her back are stable and no new lesions are seen.   Lymphadenopathy:     Cervical: No cervical adenopathy.  Skin:    General: Skin is warm and dry.     Coloration: Skin is not jaundiced or pale.     Findings: No bruising, erythema, lesion or rash.     Comments: Keratosis on her back right calf, smooth and appears totally benign.   Neurological:     General: No focal deficit present.     Mental Status: She is alert and oriented to person, place, and time. Mental status is at baseline.     Cranial Nerves: No cranial nerve deficit.     Sensory: No sensory deficit.     Motor: No weakness.     Coordination: Coordination normal.     Gait: Gait normal.     Deep Tendon Reflexes: Reflexes normal.  Psychiatric:        Mood and Affect: Mood normal.        Behavior: Behavior normal.        Thought Content: Thought content normal.        Judgment: Judgment normal.    LABS:      Latest Ref Rng & Units 12/21/2022   10:31 AM 06/21/2022    3:06 PM 12/09/2021   12:00 AM  CBC  WBC 4.0 - 10.5 K/uL 4.0  4.0  4.0      Hemoglobin 12.0 - 15.0 g/dL 16.1  09.6  13.8  Hematocrit 36.0 - 46.0 % 38.1  37.3  41      Platelets 150 - 400 K/uL 204  183  195         This result is from an external source.      Latest Ref Rng & Units 12/21/2022   10:31 AM 06/21/2022    3:06  PM 12/09/2021   12:00 AM  CMP  Glucose 70 - 99 mg/dL 098  94    BUN 6 - 20 mg/dL 14  14  15       Creatinine 0.44 - 1.00 mg/dL 1.19  1.47  1.1      Sodium 135 - 145 mmol/L 140  138  142      Potassium 3.5 - 5.1 mmol/L 3.7  3.6  3.9      Chloride 98 - 111 mmol/L 110  107  109      CO2 22 - 32 mmol/L 21  24  23       Calcium 8.9 - 10.3 mg/dL 9.0  8.7  9.4      Total Protein 6.5 - 8.1 g/dL 7.2  7.0    Total Bilirubin 0.3 - 1.2 mg/dL 0.7  0.8    Alkaline Phos 38 - 126 U/L 77  76  93      AST 15 - 41 U/L 34  25  44      ALT 0 - 44 U/L 39  28  44         This result is from an external source.   Component Ref Range & Units 6 mo ago (06/21/22) 1 yr ago (06/09/21) 1 yr ago (01/27/21) 3 yr ago (12/03/19) 3 yr ago (06/09/19)  Vitamin B-12 180 - 914 pg/mL 367 755 CM 309 CM 495 R 250 R     Lab Results  Component Value Date   LDH 167 12/21/2022   LDH 194 (H) 12/09/2021   LDH 177 06/10/2021    STUDIES:     HISTORY:   Allergies:  Allergies  Allergen Reactions   Hydrocodone-Acetaminophen     Other reaction(s): Other (see comments)   Rituximab     Other reaction(s): Not available   Tape Itching    Skin redness.   Vicodin [Hydrocodone-Acetaminophen]     Current Medications: Current Outpatient Medications  Medication Sig Dispense Refill   valACYclovir (VALTREX) 500 MG tablet Take 500 mg by mouth daily.     amLODipine (NORVASC) 5 MG tablet Take 5 mg by mouth daily.     CALCIUM-MAGNESIUM-ZINC PO Take 1 tablet by mouth daily.     cholecalciferol (VITAMIN D) 1000 units tablet Take 6,000 Units by mouth daily.     citalopram (CELEXA) 10 MG tablet Take 10 mg by mouth daily.     ezetimibe (ZETIA) 10 MG tablet Take 10 mg by mouth daily.     fluticasone (FLONASE) 50 MCG/ACT nasal spray Place 1 spray into both nostrils daily. (Patient not taking: Reported on 11/06/2022)     GLUTATHIONE PO Take by mouth. 2-3 times a day     L-Lysine 1000 MG TABS Take 1,000 mg by mouth daily.     losartan  (COZAAR) 25 MG tablet Take 25 mg by mouth daily.     Naproxen Sodium 220 MG CAPS Take by mouth.     NONFORMULARY OR COMPOUNDED ITEM Vitamin E once a day     pantoprazole (PROTONIX) 40 MG tablet Take 1 tablet (40 mg total) by mouth daily. 90 tablet 3  rosuvastatin (CRESTOR) 5 MG tablet Take 5 mg by mouth daily.      No current facility-administered medications for this visit.     ASSESSMENT & PLAN:  Assessment: 1. History of low-grade cutaneous lymphoma diagnosed in 2004 and treated with surgery and chemotherapy many years ago.   2. History of IA hormone receptor positive left breast cancer in August 2015 treated with surgery and radiation.  She did not tolerate hormonal therapy.  She remains without evidence of recurrence.  She will be due for bilateral diagnostic mammogram again in March 2023.   3. 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     4. 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 This was treated with lumpectomy.  PET was otherwise negative, so no further therapy was recommended.   5. Subareolar right breast seroma, confirmed by mammogram and ultrasound.    6.  Recurrent lymphoma of the lower outer retroareolar right breast in March 2022.  There was 1 thickened right axillary lymph node, but this was not hypermetabolic on PET imaging and biopsy was negative.  There was no evidence of lymphoma elsewhere on PET. She was treated with radiation therapy with an excellent response.  Her current mammogram is clear.  7. Mild Chronic Kidney Disease. I have repeated these labs today and will see how it compares I instructed her to push fluids especially water and to avoid NSAIDS and any nephrotoxic medications. We will see if we can find out what is in the infusions that she receives in [place].   8. Skin lesion of the left anterior upper chest wall. We will request the pathology.   Plan:     On 11/20/2022 she had a  spot on her chest removed and she hasn't heard back from Dermatology. I will contact them and request the pathology report. Henlie had labs done recently and was told she had stage 3 kidney disease due to a elevated creatinine of 1.13 with a eGFR of 56. I informed her that the biggest risks to her kidneys is from the Naproxen Sodium/NSAIDS and she takes this occasionally. I informed her that taking tylenol would be a safer option. She informed me that she has also been taking toradol and I recommended that we stop that. She also gets "immune" infusions from [facility] in [place] which is largely multiple vitamins and glutathione but she wasn't clear on all of the ingredients so I asked her to find out and got her permission to inform her PCP about the ingredients to be sure this is not harming her kidney functions. Her labs today are pending. Madicyn had a bone density scan done on 08/11/2022 that showed osteopenia of the right hip and normal bone density in her spine and dual femur. I recommended she take calcium and vitamin D supplements and have a repeat bone density scan in 46yrs. She will be due for repeat mammogram in April, 2025. She will also see her PCP in March and I will see her back in 8 months with CBC, CMP, LDH, and bilateral screening mammogram. The patient understands the plans discussed today and is in agreement with them.  She knows to contact our office if she develops concerns prior to her next appointment.  I provided 32 minutes of face-to-face time during this this encounter and > 50% was spent counseling as documented under my assessment and plan.   I,Jasmine M Lassiter,acting as a Neurosurgeon for  Dellia Beckwith, MD.,have documented all relevant documentation on the behalf of Dellia Beckwith, MD,as directed by  Dellia Beckwith, MD while in the presence of Dellia Beckwith, MD.

## 2022-12-21 ENCOUNTER — Encounter: Payer: Self-pay | Admitting: Oncology

## 2022-12-21 ENCOUNTER — Inpatient Hospital Stay: Payer: BC Managed Care – PPO

## 2022-12-21 ENCOUNTER — Other Ambulatory Visit: Payer: Self-pay | Admitting: Oncology

## 2022-12-21 ENCOUNTER — Inpatient Hospital Stay: Payer: BC Managed Care – PPO | Attending: Oncology | Admitting: Oncology

## 2022-12-21 VITALS — BP 119/84 | HR 73 | Temp 97.5°F | Resp 18 | Ht 63.5 in | Wt 203.9 lb

## 2022-12-21 DIAGNOSIS — Z170 Estrogen receptor positive status [ER+]: Secondary | ICD-10-CM | POA: Diagnosis not present

## 2022-12-21 DIAGNOSIS — N189 Chronic kidney disease, unspecified: Secondary | ICD-10-CM | POA: Insufficient documentation

## 2022-12-21 DIAGNOSIS — C8219 Follicular lymphoma grade II, extranodal and solid organ sites: Secondary | ICD-10-CM | POA: Diagnosis not present

## 2022-12-21 DIAGNOSIS — C50912 Malignant neoplasm of unspecified site of left female breast: Secondary | ICD-10-CM

## 2022-12-21 DIAGNOSIS — D519 Vitamin B12 deficiency anemia, unspecified: Secondary | ICD-10-CM

## 2022-12-21 DIAGNOSIS — Z79899 Other long term (current) drug therapy: Secondary | ICD-10-CM | POA: Diagnosis not present

## 2022-12-21 DIAGNOSIS — N1831 Chronic kidney disease, stage 3a: Secondary | ICD-10-CM | POA: Diagnosis not present

## 2022-12-21 DIAGNOSIS — C8588 Other specified types of non-Hodgkin lymphoma, lymph nodes of multiple sites: Secondary | ICD-10-CM | POA: Diagnosis present

## 2022-12-21 LAB — CMP (CANCER CENTER ONLY)
ALT: 39 U/L (ref 0–44)
AST: 34 U/L (ref 15–41)
Albumin: 4.3 g/dL (ref 3.5–5.0)
Alkaline Phosphatase: 77 U/L (ref 38–126)
Anion gap: 9 (ref 5–15)
BUN: 14 mg/dL (ref 6–20)
CO2: 21 mmol/L — ABNORMAL LOW (ref 22–32)
Calcium: 9 mg/dL (ref 8.9–10.3)
Chloride: 110 mmol/L (ref 98–111)
Creatinine: 1.14 mg/dL — ABNORMAL HIGH (ref 0.44–1.00)
GFR, Estimated: 56 mL/min — ABNORMAL LOW (ref >60)
Glucose, Bld: 100 mg/dL — ABNORMAL HIGH (ref 70–99)
Potassium: 3.7 mmol/L (ref 3.5–5.1)
Sodium: 140 mmol/L (ref 135–145)
Total Bilirubin: 0.7 mg/dL (ref 0.3–1.2)
Total Protein: 7.2 g/dL (ref 6.5–8.1)

## 2022-12-21 LAB — CBC WITH DIFFERENTIAL (CANCER CENTER ONLY)
Abs Immature Granulocytes: 0.01 10*3/uL (ref 0.00–0.07)
Basophils Absolute: 0 10*3/uL (ref 0.0–0.1)
Basophils Relative: 1 %
Eosinophils Absolute: 0.1 10*3/uL (ref 0.0–0.5)
Eosinophils Relative: 3 %
HCT: 38.1 % (ref 36.0–46.0)
Hemoglobin: 12.8 g/dL (ref 12.0–15.0)
Immature Granulocytes: 0 %
Lymphocytes Relative: 27 %
Lymphs Abs: 1.1 10*3/uL (ref 0.7–4.0)
MCH: 28.3 pg (ref 26.0–34.0)
MCHC: 33.6 g/dL (ref 30.0–36.0)
MCV: 84.1 fL (ref 80.0–100.0)
Monocytes Absolute: 0.3 10*3/uL (ref 0.1–1.0)
Monocytes Relative: 7 %
Neutro Abs: 2.5 10*3/uL (ref 1.7–7.7)
Neutrophils Relative %: 62 %
Platelet Count: 204 10*3/uL (ref 150–400)
RBC: 4.53 MIL/uL (ref 3.87–5.11)
RDW: 13.3 % (ref 11.5–15.5)
WBC Count: 4 10*3/uL (ref 4.0–10.5)
nRBC: 0 % (ref 0.0–0.2)

## 2022-12-21 LAB — VITAMIN B12: Vitamin B-12: 1212 pg/mL — ABNORMAL HIGH (ref 180–914)

## 2022-12-21 LAB — LACTATE DEHYDROGENASE: LDH: 167 U/L (ref 98–192)

## 2022-12-25 ENCOUNTER — Telehealth: Payer: Self-pay

## 2022-12-25 NOTE — Telephone Encounter (Signed)
[facility] for recent pathology to be faxed over

## 2022-12-26 DIAGNOSIS — N183 Chronic kidney disease, stage 3 unspecified: Secondary | ICD-10-CM | POA: Insufficient documentation

## 2023-01-04 ENCOUNTER — Telehealth: Payer: Self-pay

## 2023-01-04 NOTE — Telephone Encounter (Signed)
Attempted to contact patient. No answer. 

## 2023-01-04 NOTE — Telephone Encounter (Signed)
-----   Message from Dellia Beckwith sent at 12/26/2022  7:15 PM EDT ----- Regarding: call Tell her I did receive the pathology of her skin lesion and it was a benign mole.  Her labs look good with a blood sugar of 100 and normal blood counts.  Her kidney function was the same as the labs from last month.  Her B12 level is over thousand so she has plenty of B12.  Send copies to [employer] [facility] as well as [facility] in [place]

## 2023-03-23 DIAGNOSIS — R079 Chest pain, unspecified: Secondary | ICD-10-CM | POA: Diagnosis not present

## 2023-03-24 DIAGNOSIS — R079 Chest pain, unspecified: Secondary | ICD-10-CM | POA: Diagnosis not present

## 2023-06-19 ENCOUNTER — Inpatient Hospital Stay (HOSPITAL_BASED_OUTPATIENT_CLINIC_OR_DEPARTMENT_OTHER): Admission: RE | Admit: 2023-06-19 | Source: Ambulatory Visit | Admitting: Radiology

## 2023-06-25 ENCOUNTER — Ambulatory Visit (HOSPITAL_BASED_OUTPATIENT_CLINIC_OR_DEPARTMENT_OTHER): Admitting: Radiology

## 2023-06-26 ENCOUNTER — Ambulatory Visit (INDEPENDENT_AMBULATORY_CARE_PROVIDER_SITE_OTHER)
Admission: RE | Admit: 2023-06-26 | Discharge: 2023-06-26 | Disposition: A | Discharge status: Home / Self Care | Source: Ambulatory Visit | Attending: Oncology | Admitting: Oncology

## 2023-06-26 ENCOUNTER — Encounter (HOSPITAL_BASED_OUTPATIENT_CLINIC_OR_DEPARTMENT_OTHER): Payer: Self-pay | Admitting: Radiology

## 2023-06-26 DIAGNOSIS — Z1231 Encounter for screening mammogram for malignant neoplasm of breast: Secondary | ICD-10-CM | POA: Diagnosis not present

## 2023-06-26 DIAGNOSIS — C50912 Malignant neoplasm of unspecified site of left female breast: Secondary | ICD-10-CM

## 2023-06-27 ENCOUNTER — Telehealth: Payer: Self-pay | Admitting: Oncology

## 2023-06-27 ENCOUNTER — Other Ambulatory Visit: Payer: Self-pay | Admitting: Oncology

## 2023-06-27 ENCOUNTER — Inpatient Hospital Stay: Payer: BC Managed Care – PPO

## 2023-06-27 ENCOUNTER — Inpatient Hospital Stay: Payer: BC Managed Care – PPO | Attending: Oncology | Admitting: Oncology

## 2023-06-27 ENCOUNTER — Other Ambulatory Visit: Payer: Self-pay

## 2023-06-27 VITALS — BP 136/78 | HR 54 | Temp 98.2°F | Resp 16 | Ht 63.5 in | Wt 201.1 lb

## 2023-06-27 DIAGNOSIS — Z853 Personal history of malignant neoplasm of breast: Secondary | ICD-10-CM | POA: Insufficient documentation

## 2023-06-27 DIAGNOSIS — D519 Vitamin B12 deficiency anemia, unspecified: Secondary | ICD-10-CM

## 2023-06-27 DIAGNOSIS — Z79899 Other long term (current) drug therapy: Secondary | ICD-10-CM | POA: Insufficient documentation

## 2023-06-27 DIAGNOSIS — C8299 Follicular lymphoma, unspecified, extranodal and solid organ sites: Secondary | ICD-10-CM | POA: Insufficient documentation

## 2023-06-27 DIAGNOSIS — Z170 Estrogen receptor positive status [ER+]: Secondary | ICD-10-CM

## 2023-06-27 DIAGNOSIS — L989 Disorder of the skin and subcutaneous tissue, unspecified: Secondary | ICD-10-CM | POA: Insufficient documentation

## 2023-06-27 DIAGNOSIS — M8589 Other specified disorders of bone density and structure, multiple sites: Secondary | ICD-10-CM | POA: Insufficient documentation

## 2023-06-27 DIAGNOSIS — Z923 Personal history of irradiation: Secondary | ICD-10-CM | POA: Insufficient documentation

## 2023-06-27 DIAGNOSIS — C50912 Malignant neoplasm of unspecified site of left female breast: Secondary | ICD-10-CM | POA: Diagnosis not present

## 2023-06-27 DIAGNOSIS — N189 Chronic kidney disease, unspecified: Secondary | ICD-10-CM | POA: Insufficient documentation

## 2023-06-27 DIAGNOSIS — C8219 Follicular lymphoma grade II, extranodal and solid organ sites: Secondary | ICD-10-CM

## 2023-06-27 LAB — CMP (CANCER CENTER ONLY)
ALT: 18 U/L (ref 0–44)
AST: 27 U/L (ref 15–41)
Albumin: 4.2 g/dL (ref 3.5–5.0)
Alkaline Phosphatase: 92 U/L (ref 38–126)
Anion gap: 11 (ref 5–15)
BUN: 9 mg/dL (ref 6–20)
CO2: 24 mmol/L (ref 22–32)
Calcium: 9.5 mg/dL (ref 8.9–10.3)
Chloride: 107 mmol/L (ref 98–111)
Creatinine: 1.29 mg/dL — ABNORMAL HIGH (ref 0.44–1.00)
GFR, Estimated: 48 mL/min — ABNORMAL LOW (ref >60)
Glucose, Bld: 87 mg/dL (ref 70–99)
Potassium: 3.8 mmol/L (ref 3.5–5.1)
Sodium: 141 mmol/L (ref 135–145)
Total Bilirubin: 0.6 mg/dL (ref 0.0–1.2)
Total Protein: 6.9 g/dL (ref 6.5–8.1)

## 2023-06-27 LAB — CBC WITH DIFFERENTIAL (CANCER CENTER ONLY)
Abs Immature Granulocytes: 0.01 10*3/uL (ref 0.00–0.07)
Basophils Absolute: 0 10*3/uL (ref 0.0–0.1)
Basophils Relative: 1 %
Eosinophils Absolute: 0.1 10*3/uL (ref 0.0–0.5)
Eosinophils Relative: 3 %
HCT: 35.3 % — ABNORMAL LOW (ref 36.0–46.0)
Hemoglobin: 12.3 g/dL (ref 12.0–15.0)
Immature Granulocytes: 0 %
Lymphocytes Relative: 31 %
Lymphs Abs: 1.2 10*3/uL (ref 0.7–4.0)
MCH: 28.3 pg (ref 26.0–34.0)
MCHC: 34.8 g/dL (ref 30.0–36.0)
MCV: 81.1 fL (ref 80.0–100.0)
Monocytes Absolute: 0.4 10*3/uL (ref 0.1–1.0)
Monocytes Relative: 9 %
Neutro Abs: 2.3 10*3/uL (ref 1.7–7.7)
Neutrophils Relative %: 56 %
Platelet Count: 192 10*3/uL (ref 150–400)
RBC: 4.35 MIL/uL (ref 3.87–5.11)
RDW: 13.3 % (ref 11.5–15.5)
WBC Count: 4 10*3/uL (ref 4.0–10.5)
nRBC: 0 % (ref 0.0–0.2)
nRBC: 0 /100{WBCs}

## 2023-06-27 LAB — VITAMIN B12: Vitamin B-12: 398 pg/mL (ref 180–914)

## 2023-06-27 LAB — LACTATE DEHYDROGENASE: LDH: 195 U/L — ABNORMAL HIGH (ref 98–192)

## 2023-06-27 NOTE — Progress Notes (Signed)
 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 female with a history of remote history of a low-grade cutaneous B-cell lymphoma, which was in remission for 10 years.  This was originally found in a skin lesion in 2004, biopsy of which was felt to represent lymphoproliferative tissue, but not definitively lymphoma.  A recurrent skin lesion of her back in 2006 was excised and pathology revealed a low-grade B-cell lymphoma.  She did not have evidence of disease elsewhere.  She received chemotherapy with CVP/rituximab.  She had an allergic reaction to rituximab with her first dose, so this was discontinued.  She had an excellent response to CVP alone.  This was completed in June 2006.     She was found to have stage IA (T1c N0 M0) hormone receptor positive left breast cancer in August 2015.  She was treated with lumpectomy.  Pathology revealed a 1.1 cm, grade 2, invasive ductal carcinoma with 3 negative nodes and a close deep margin.  Estrogen and progesterone receptors were positive and her 2 Neu negative.  Ki 67 was 28%.  Oncotype DX testing revealed a recurrence score of 17, which is in the low risk category, so chemotherapy was not recommended.  She received adjuvant radiation to the left breast, which was completed in November 2015. She was placed on hormonal therapy with tamoxifen in December, as she was still premenopausal.  Within a few months, she had severe toxicities of fatigue, lightheadedness, dyspnea, and paresthesias of her hands, so tamoxifen was discontinued.  The symptoms abated when she stopped the medication.  Rather than undergo Lupron, the patient underwent a bilateral salpingo-oophorectomy  in June 2016.  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  Staging scans did not reveal any other areas of disease, so we opted for observation only.  We did eventually try her on an aromatase inhibitor for her breast cancer, but she did not tolerate this either.  She previously had B12 deficiency treated with B12 injections, but these were discontinued and she does not take oral B12 supplement.  She has not had evidence of recurrent B12 deficiency.  She has also had abnormal liver transaminases and intermittent leukopenia.  Probable hepatic steatosis was noted on CT abdomen and pelvis, as well as abdominal ultrasound.  She had previous iron deficiency, which resolved.     Due to her personal and family history of cancer she did undergo testing for hereditary cancer syndromes with the [employer] Cancer Next gene panel.  This did not reveal any clinically significant mutation or variants of uncertain significance.     Bilateral mammogram in August 2017 did not reveal any evidence of malignancy.  Ultrasound of the left breast was done due to firmness of the lumpectomy site and revealed a 3.9 cm oval mass which was circumscribed with layering debris consistent with a complex seroma.  This was stable in size and felt to be benign.   Echocardiogram in March of 2020 revealed normal left ventricular size and function with an ejection fraction of 60-65%.  She was added to the schedule on May 26th, as she phoned reporting a new right breast mass.  Right diagnostic mammogram and ultrasound were performed  The mammogram was negative, but the ultrasound revealed a 3 cm in diameter hypoechoic mass at 9 o'clock 1 cm from the nipple.  Biopsy was consistent with a lymphoproliferative disorder.  After reviewing the report of the stains and description, we felt this represented recurrence of her lymphoma.  PET scan revealed hypermetabolic activity in the right breast with an SUV of 5.4,  but no other areas of hyper metabolism.  She was treated with right lumpectomy in June. 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  Margins were clear.  As this was completely excised, no further therapy for the lymphoma was recommended.  She had COVID-19 in July 2020, but had very minimal symptoms.  Colonoscopy, as well as EGD were done in November 2020 by Dr. Venice Gillis.   In February 2021, she had a palpable mass in the right breast under the nipple, which was mildly tender.  Diagnostic bilateral mammogram and bilateral breast ultrasound revealed a 2.8 cm retroareolar postoperative seroma containing a small amount of entrapped fat with overlying postsurgical scar tissue.  Normal appearing breast tissue throughout the upper outer left breast including dense glandular tissue  with a rounded peak, corresponding to the area of palpable nodularity and tenderness.  There was a no evidence of malignancy. In April 2021, she had mild anemia with a low normal ferritin. She was on an iron supplement for a time.  We recommended increasing iron in her diet.  In February 2022 , she was seen for abdominal complaints. CT chest, abdomen and pelvis was negative.    We recommended that she see Dr.Gupta for follow up.  Bilateral diagnostic mammogram and ultrasound in March revealed suspicious hypoechoic vascular material likely within ducts of the lower outer retroareolar right breast, as well as 1 thickened right axillary lymph node with a cortex measuring 5 mm.  Ultrasound guided biopsies of the right breast and right axillary node were obtained and pathology was  consistent with low grade lymphoma of the breast. The lymph node was negative. The histology was quite similar to her previous lymphomas.  PET scan revealed intense activity associated with the right breast mass consistent with recurrent lymphoma.  There was no evidence of hypermetabolic metastatic lymphadenopathy elsewhere.  Spleen and  bone marrow were normal.  There is moderate activity within the left humeral head favored to be posttraumatic or degenerative.  As her disease was localized, we recommended radiation to the right breast, which she completed in May. She is up-to-date on pelvic examination, as well as colonoscopy.    INTERVAL HISTORY:  Kerri Jones is here for routine follow up for recurrent low-grade lymphoma of multiple sites and left breast cancer. Patient states that she feels well but fell in February and fractured her right wrist which is still sore. Bone density scan done on 08/11/2022 showed osteopenia of the right hip and normal bone density in her spine and dual femur. She takes a oral supplement that has calcium, magnesium, and zinc.I asked her to also take vitamin D. She had a screening bilateral mammogram done on 06/26/2023 that was clear. She has a WBC of 4.0, hemoglobin of 12.3, and platelet count of 192,000. Her CMP is normal other than a elevated creatinine of 1.29 with a eGFR of 48 up from 1.14 with a eGRF of 56. Her LDH is pending and I will add a B-12 and vitamin D level to her labs. She admits to needing to  increase her water intake. I will send a copy of her report to her PCP. I will see her back in 6 months with CBC, CMP, and LDH. She denies fever, chills, night sweats, or other signs of infection. She denies cardiorespiratory and gastrointestinal issues. Her appetite is good and her weight has decreased 2 pounds over last 6 months.   REVIEW OF SYSTEMS:  Review of Systems  Constitutional: Negative.  Negative for appetite change, chills, diaphoresis, fatigue, fever and unexpected weight change.  HENT:  Negative.  Negative for hearing loss, lump/mass, mouth sores, nosebleeds, sore throat, tinnitus, trouble swallowing and voice change.   Eyes: Negative.  Negative for eye problems and icterus.  Respiratory: Negative.  Negative for chest tightness, cough, hemoptysis, shortness of breath and wheezing.    Cardiovascular: Negative.  Negative for chest pain, leg swelling and palpitations.  Gastrointestinal: Negative.  Negative for abdominal distention, abdominal pain, blood in stool, constipation, diarrhea, nausea, rectal pain and vomiting.  Endocrine: Negative.   Genitourinary: Negative.  Negative for bladder incontinence, difficulty urinating, dyspareunia, dysuria, frequency, hematuria, menstrual problem, nocturia, pelvic pain, vaginal bleeding and vaginal discharge.   Musculoskeletal: Negative.  Negative for arthralgias, back pain, flank pain, gait problem, myalgias, neck pain and neck stiffness.       Wrist soreness from previous fracture   Skin: Negative.  Negative for itching, rash and wound.  Neurological: Negative.  Negative for dizziness, extremity weakness, gait problem, headaches, light-headedness, numbness, seizures and speech difficulty.  Hematological: Negative.  Negative for adenopathy. Does not bruise/bleed easily.  Psychiatric/Behavioral: Negative.  Negative for confusion, decreased concentration, depression, sleep disturbance and suicidal ideas. The patient is not nervous/anxious.     VITALS:  Blood pressure 136/78, pulse (!) 54, temperature 98.2 F (36.8 C), temperature source Oral, resp. rate 16, height 5' 3.5" (1.613 m), weight 201 lb 1.6 oz (91.2 kg), SpO2 99%.  Wt Readings from Last 3 Encounters:  06/27/23 201 lb 1.6 oz (91.2 kg)  12/21/22 203 lb 14.4 oz (92.5 kg)  11/06/22 206 lb (93.4 kg)    Body mass index is 35.06 kg/m.  Performance status (ECOG): 0 - Asymptomatic  PHYSICAL EXAM:  Physical Exam Vitals and nursing note reviewed.  Constitutional:      General: She is not in acute distress.    Appearance: Normal appearance. She is normal weight. She is not ill-appearing, toxic-appearing or diaphoretic.  HENT:     Head: Normocephalic and atraumatic.     Right Ear: Tympanic membrane, ear canal and external ear normal. There is no impacted cerumen.     Left Ear:  Tympanic membrane, ear canal and external ear normal. There is no impacted cerumen.     Nose: Nose normal. No congestion or rhinorrhea.     Mouth/Throat:     Mouth: Mucous membranes are moist.     Pharynx: Oropharynx is clear. No oropharyngeal exudate or posterior oropharyngeal erythema.  Eyes:     General: No scleral icterus.       Right eye: No discharge.        Left eye: No discharge.     Extraocular Movements: Extraocular movements intact.     Conjunctiva/sclera: Conjunctivae normal.     Pupils: Pupils are equal, round, and reactive to light.  Neck:     Vascular: No carotid bruit.  Cardiovascular:     Rate and Rhythm: Normal rate and regular rhythm.     Pulses: Normal pulses.     Heart sounds: Normal heart sounds. No murmur  heard.    No friction rub. No gallop.  Pulmonary:     Effort: Pulmonary effort is normal. No respiratory distress.     Breath sounds: Normal breath sounds. No stridor. No wheezing, rhonchi or rales.  Chest:     Chest wall: No tenderness.     Comments: Small scar in the upper mid chest Well healed scar in the upper inner quadrant of the left breast which feels slightly nodular Left axillary scar is well healed No masses in either breast Abdominal:     General: Bowel sounds are normal. There is no distension.     Palpations: Abdomen is soft. There is no hepatomegaly, splenomegaly or mass.     Tenderness: There is no abdominal tenderness. There is no right CVA tenderness, left CVA tenderness, guarding or rebound.     Hernia: No hernia is present.  Musculoskeletal:        General: No swelling, tenderness, deformity or signs of injury. Normal range of motion.     Cervical back: Normal range of motion and neck supple. No rigidity or tenderness.     Right lower leg: No edema.     Left lower leg: No edema.  Lymphadenopathy:     Cervical: No cervical adenopathy.  Skin:    General: Skin is warm and dry.     Coloration: Skin is not jaundiced or pale.      Findings: No bruising, erythema, lesion or rash.     Comments: Well healed scar in the upper mid back and another in the left mid back  Neurological:     General: No focal deficit present.     Mental Status: She is alert and oriented to person, place, and time. Mental status is at baseline.     Cranial Nerves: No cranial nerve deficit.     Sensory: No sensory deficit.     Motor: No weakness.     Coordination: Coordination normal.     Gait: Gait normal.     Deep Tendon Reflexes: Reflexes normal.  Psychiatric:        Mood and Affect: Mood normal.        Behavior: Behavior normal.        Thought Content: Thought content normal.        Judgment: Judgment normal.    LABS:      Latest Ref Rng & Units 06/27/2023    2:01 PM 12/21/2022   10:31 AM 06/21/2022    3:06 PM  CBC  WBC 4.0 - 10.5 K/uL 4.0  4.0  4.0   Hemoglobin 12.0 - 15.0 g/dL 16.1  09.6  04.5   Hematocrit 36.0 - 46.0 % 35.3  38.1  37.3   Platelets 150 - 400 K/uL 192  204  183       Latest Ref Rng & Units 06/27/2023    2:01 PM 12/21/2022   10:31 AM 06/21/2022    3:06 PM  CMP  Glucose 70 - 99 mg/dL 87  409  94   BUN 6 - 20 mg/dL 9  14  14    Creatinine 0.44 - 1.00 mg/dL 8.11  9.14  7.82   Sodium 135 - 145 mmol/L 141  140  138   Potassium 3.5 - 5.1 mmol/L 3.8  3.7  3.6   Chloride 98 - 111 mmol/L 107  110  107   CO2 22 - 32 mmol/L 24  21  24    Calcium 8.9 - 10.3 mg/dL 9.5  9.0  8.7  Total Protein 6.5 - 8.1 g/dL 6.9  7.2  7.0   Total Bilirubin 0.0 - 1.2 mg/dL 0.6  0.7  0.8   Alkaline Phos 38 - 126 U/L 92  77  76   AST 15 - 41 U/L 27  34  25   ALT 0 - 44 U/L 18  39  28    Component Ref Range & Units 6 mo ago (06/21/22) 1 yr ago (06/09/21) 1 yr ago (01/27/21) 3 yr ago (12/03/19) 3 yr ago (06/09/19)  Vitamin B-12 180 - 914 pg/mL 367 755 CM 309 CM 495 R 250 R     Lab Results  Component Value Date   LDH 195 (H) 06/27/2023   LDH 167 12/21/2022   LDH 194 (H) 12/09/2021    STUDIES:     HISTORY:   Allergies:   Allergies  Allergen Reactions   Hydrocodone-Acetaminophen     insomnia   Rituximab Other (See Comments)    Other reaction(s): Not available  rituximab   Tape Itching    Skin redness.   Vicodin [Hydrocodone-Acetaminophen]     Insomnia     Current Medications: Current Outpatient Medications  Medication Sig Dispense Refill   amLODipine (NORVASC) 5 MG tablet Take 5 mg by mouth daily.     CALCIUM-MAGNESIUM-ZINC PO Take 1 tablet by mouth daily.     cholecalciferol (VITAMIN D) 1000 units tablet Take 6,000 Units by mouth daily.     citalopram (CELEXA) 10 MG tablet Take 10 mg by mouth daily.     ezetimibe (ZETIA) 10 MG tablet Take 10 mg by mouth daily.     GLUTATHIONE PO Take by mouth. 2-3 times a day     L-Lysine 1000 MG TABS Take 1,000 mg by mouth daily.     losartan (COZAAR) 25 MG tablet Take 25 mg by mouth daily.     Naproxen Sodium 220 MG CAPS Take by mouth.     NONFORMULARY OR COMPOUNDED ITEM Vitamin E once a day     pantoprazole  (PROTONIX ) 40 MG tablet Take 1 tablet (40 mg total) by mouth daily. 90 tablet 3   rosuvastatin (CRESTOR) 5 MG tablet Take 5 mg by mouth daily.      valACYclovir (VALTREX) 500 MG tablet Take 500 mg by mouth daily.     No current facility-administered medications for this visit.   ASSESSMENT & PLAN:  Assessment: 1. History of low-grade cutaneous lymphoma diagnosed in 2004 and treated with surgery and chemotherapy many years ago.   2. History of IA hormone receptor positive left breast cancer in August 2015 treated with surgery and radiation.  She did not tolerate hormonal therapy.  She remains without evidence of recurrence.  She had a screening bilateral mammogram done on 06/26/2023 that was clear.   3. 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     4. 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 This was treated with lumpectomy.  PET was otherwise negative, so no further therapy was  recommended.   5. Subareolar right breast seroma, confirmed by mammogram and ultrasound.    6.  Recurrent lymphoma of the lower outer retroareolar right breast in March 2022.  There was 1 thickened right axillary lymph node, but this was not hypermetabolic on PET imaging and biopsy was negative.  There was no evidence of lymphoma elsewhere on PET. She was treated with radiation therapy with an excellent response.  Her current mammogram is  clear.  7. Mild Chronic Kidney Disease. I have repeated these labs today and her renal function has worsened.  I instructed her to push fluids especially water and to avoid NSAIDS and any nephrotoxic medications. We will see if we can find out what is in the infusions that she receives in [place].   8. Skin lesion of the left anterior upper chest wall. We will request the pathology.   Plan:     Bone density scan done on 08/11/2022 showed osteopenia of the right hip and normal bone density in her spine and dual femur. She takes a oral supplement that has calcium, magnesium, and zinc.I asked her to also take vitamin D. She had a screening bilateral mammogram done on 06/26/2023 that was clear. She has a WBC of 4.0, hemoglobin of 12.3, and platelet count of 192,000. Her CMP is normal other than a elevated creatinine of 1.29 with a eGFR of 48 up from 1.14 with a eGRF of 56. Her LDH is pending and I will add a B-12 and vitamin D level to her labs. She admits to needing to increase her water intake. I will send a copy of her report to her PCP. I will see her back in 6 months with CBC, CMP, and LDH. The patient understands the plans discussed today and is in agreement with them.  She knows to contact our office if she develops concerns prior to her next appointment.  I provided 32 minutes of face-to-face time during this this encounter and > 50% was spent counseling as documented under my assessment and plan.   Nolia Baumgartner, MD  [facility]  [facility] OF [facility] [address] [place] [address] Dept: [phone] Dept Fax: [phone]   No orders of the defined types were placed in this encounter.   I,Jasmine M Lassiter,acting as a scribe for Nolia Baumgartner, MD.,have documented all relevant documentation on the behalf of Nolia Baumgartner, MD,as directed by  Nolia Baumgartner, MD while in the presence of Nolia Baumgartner, MD.

## 2023-06-27 NOTE — Telephone Encounter (Signed)
 Patient has been scheduled for follow-up visit per 05/3023 LOS.  Pt given an appt calendar with date and time.

## 2023-07-09 LAB — VITAMIN D 1,25 DIHYDROXY
Vitamin D 1, 25 (OH)2 Total: 55 pg/mL
Vitamin D2 1, 25 (OH)2: <10 pg/mL
Vitamin D3 1, 25 (OH)2: 55 pg/mL

## 2023-07-11 ENCOUNTER — Other Ambulatory Visit: Payer: Self-pay | Admitting: Oncology

## 2023-07-11 ENCOUNTER — Encounter: Payer: Self-pay | Admitting: Oncology

## 2023-07-11 DIAGNOSIS — C8219 Follicular lymphoma grade II, extranodal and solid organ sites: Secondary | ICD-10-CM

## 2023-07-12 ENCOUNTER — Telehealth: Payer: Self-pay

## 2023-07-12 NOTE — Telephone Encounter (Signed)
-----   Message from Nolia Baumgartner sent at 07/11/2023  6:52 PM EDT ----- Regarding: call Tell her Vitamin D and B12 are good

## 2023-07-12 NOTE — Telephone Encounter (Signed)
 Attempted to contact patient. No answer and no VM.

## 2023-12-11 ENCOUNTER — Other Ambulatory Visit: Payer: Self-pay | Admitting: Gastroenterology

## 2023-12-27 ENCOUNTER — Inpatient Hospital Stay: Attending: Oncology | Admitting: Oncology

## 2023-12-27 ENCOUNTER — Other Ambulatory Visit: Payer: Self-pay | Admitting: Oncology

## 2023-12-27 ENCOUNTER — Telehealth: Payer: Self-pay | Admitting: Oncology

## 2023-12-27 ENCOUNTER — Inpatient Hospital Stay

## 2023-12-27 VITALS — BP 126/80 | HR 70 | Temp 98.1°F | Resp 16 | Ht 63.5 in | Wt 208.9 lb

## 2023-12-27 DIAGNOSIS — C8590 Non-Hodgkin lymphoma, unspecified, unspecified site: Secondary | ICD-10-CM | POA: Diagnosis present

## 2023-12-27 DIAGNOSIS — C50912 Malignant neoplasm of unspecified site of left female breast: Secondary | ICD-10-CM | POA: Diagnosis not present

## 2023-12-27 DIAGNOSIS — C8219 Follicular lymphoma grade II, extranodal and solid organ sites: Secondary | ICD-10-CM | POA: Diagnosis not present

## 2023-12-27 DIAGNOSIS — Z170 Estrogen receptor positive status [ER+]: Secondary | ICD-10-CM | POA: Diagnosis not present

## 2023-12-27 DIAGNOSIS — C8599 Non-Hodgkin lymphoma, unspecified, extranodal and solid organ sites: Secondary | ICD-10-CM

## 2023-12-27 LAB — CBC WITH DIFFERENTIAL (CANCER CENTER ONLY)
Abs Immature Granulocytes: 0.01 K/uL (ref 0.00–0.07)
Basophils Absolute: 0 K/uL (ref 0.0–0.1)
Basophils Relative: 1 %
Eosinophils Absolute: 0.1 K/uL (ref 0.0–0.5)
Eosinophils Relative: 4 %
HCT: 38.4 % (ref 36.0–46.0)
Hemoglobin: 12.9 g/dL (ref 12.0–15.0)
Immature Granulocytes: 0 %
Lymphocytes Relative: 27 %
Lymphs Abs: 1.1 K/uL (ref 0.7–4.0)
MCH: 27.8 pg (ref 26.0–34.0)
MCHC: 33.6 g/dL (ref 30.0–36.0)
MCV: 82.8 fL (ref 80.0–100.0)
Monocytes Absolute: 0.3 K/uL (ref 0.1–1.0)
Monocytes Relative: 7 %
Neutro Abs: 2.4 K/uL (ref 1.7–7.7)
Neutrophils Relative %: 61 %
Platelet Count: 175 K/uL (ref 150–400)
RBC: 4.64 MIL/uL (ref 3.87–5.11)
RDW: 13.2 % (ref 11.5–15.5)
WBC Count: 4 K/uL (ref 4.0–10.5)
nRBC: 0 % (ref 0.0–0.2)

## 2023-12-27 LAB — CMP (CANCER CENTER ONLY)
ALT: 46 U/L — ABNORMAL HIGH (ref 0–44)
AST: 39 U/L (ref 15–41)
Albumin: 4.5 g/dL (ref 3.5–5.0)
Alkaline Phosphatase: 95 U/L (ref 38–126)
Anion gap: 12 (ref 5–15)
BUN: 12 mg/dL (ref 6–20)
CO2: 24 mmol/L (ref 22–32)
Calcium: 9.2 mg/dL (ref 8.9–10.3)
Chloride: 106 mmol/L (ref 98–111)
Creatinine: 1.09 mg/dL — ABNORMAL HIGH (ref 0.44–1.00)
GFR, Estimated: 58 mL/min — ABNORMAL LOW (ref >60)
Glucose, Bld: 128 mg/dL — ABNORMAL HIGH (ref 70–99)
Potassium: 3.8 mmol/L (ref 3.5–5.1)
Sodium: 142 mmol/L (ref 135–145)
Total Bilirubin: 0.7 mg/dL (ref 0.0–1.2)
Total Protein: 7.1 g/dL (ref 6.5–8.1)

## 2023-12-27 LAB — LACTATE DEHYDROGENASE: LDH: 209 U/L — ABNORMAL HIGH (ref 98–192)

## 2023-12-27 NOTE — Progress Notes (Signed)
 [facility]  [address] 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 female with a history of remote history of a low-grade cutaneous B-cell lymphoma, which was in remission for 10 years.  This was originally found in a skin lesion in 2004, biopsy of which was felt to represent lymphoproliferative tissue, but not definitively lymphoma.  A recurrent skin lesion of her back in 2006 was excised and pathology revealed a low-grade B-cell lymphoma.  She did not have evidence of disease elsewhere.  She received chemotherapy with CVP/rituximab.  She had an allergic reaction to rituximab with her first dose, so this was discontinued.  She had an excellent response to CVP alone.  This was completed in June 2006.     She was found to have stage IA (T1c N0 M0) hormone receptor positive left breast cancer in August 2015.  She was treated with lumpectomy.  Pathology revealed a 1.1 cm, grade 2, invasive ductal carcinoma with 3 negative nodes and a close deep margin.  Estrogen and progesterone receptors were positive and her 2 Neu negative.  Ki 67 was 28%.  Oncotype DX testing revealed a recurrence score of 17, which is in the low risk category, so chemotherapy was not recommended.  She received adjuvant radiation to the left breast, which was completed in November 2015. She was placed on hormonal therapy with tamoxifen in December, as she was still premenopausal.  Within a few months, she had severe toxicities of fatigue, lightheadedness, dyspnea, and paresthesias of her hands, so tamoxifen was discontinued.  The symptoms abated when she stopped the medication.  Rather than undergo Lupron, the patient underwent a bilateral  salpingo-oophorectomy in June 2016.  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  Staging scans did not reveal any other areas of disease, so we opted for observation only.  We did eventually try her on an aromatase inhibitor for her breast cancer, but she did not tolerate this either.  She previously had B12 deficiency treated with B12 injections, but these were discontinued and she does not take oral B12 supplement.  She has not had evidence of recurrent B12 deficiency.  She has also had abnormal liver transaminases and intermittent leukopenia.  Probable hepatic steatosis was noted on CT abdomen and pelvis, as well as abdominal ultrasound.  She had previous iron deficiency, which resolved.     Due to her personal and family history of cancer she did undergo testing for hereditary cancer syndromes with the [employer] Cancer Next gene panel.  This did not reveal any clinically significant mutation or variants of uncertain significance.     Bilateral mammogram in August 2017 did not reveal any evidence of malignancy.  Ultrasound of the left breast was done due to firmness of the lumpectomy site and revealed a 3.9 cm oval mass which was circumscribed with layering debris consistent with a complex seroma.  This was stable in size and felt to be benign.   Echocardiogram in March of 2020 revealed normal left ventricular size and function with an ejection fraction of 60-65%.  She was added to the schedule on May 26th, as she phoned reporting a new right breast mass.  Right diagnostic mammogram and ultrasound were performed  The mammogram was negative, but the ultrasound revealed a 3 cm in diameter hypoechoic mass at 9 o'clock 1 cm from the nipple.  Biopsy was consistent with a lymphoproliferative disorder.  After reviewing the report of the stains and description, we felt this represented recurrence of her lymphoma.  PET scan revealed hypermetabolic activity in the right breast  with an SUV of 5.4, but no other areas of hyper metabolism.  She was treated with right lumpectomy in June. 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  Margins were clear.  As this was completely excised, no further therapy for the lymphoma was recommended.  She had COVID-19 in July 2020, but had very minimal symptoms.  Colonoscopy, as well as EGD were done in November 2020 by Dr. Charlanne.   In February 2021, she had a palpable mass in the right breast under the nipple, which was mildly tender.  Diagnostic bilateral mammogram and bilateral breast ultrasound revealed a 2.8 cm retroareolar postoperative seroma containing a small amount of entrapped fat with overlying postsurgical scar tissue.  Normal appearing breast tissue throughout the upper outer left breast including dense glandular tissue  with a rounded peak, corresponding to the area of palpable nodularity and tenderness.  There was a no evidence of malignancy. In April 2021, she had mild anemia with a low normal ferritin. She was on an iron supplement for a time.  We recommended increasing iron in her diet.  In February 2022 , she was seen for abdominal complaints. CT chest, abdomen and pelvis was negative.    We recommended that she see Dr.Gupta for follow up.  Bilateral diagnostic mammogram and ultrasound in March revealed suspicious hypoechoic vascular material likely within ducts of the lower outer retroareolar right breast, as well as 1 thickened right axillary lymph node with a cortex measuring 5 mm.  Ultrasound guided biopsies of the right breast and right axillary node were obtained and pathology was  consistent with low grade lymphoma of the breast. The lymph node was negative. The histology was quite similar to her previous lymphomas.  PET scan revealed intense activity associated with the right breast mass consistent with recurrent lymphoma.  There was no evidence of hypermetabolic metastatic lymphadenopathy  elsewhere.  Spleen and bone marrow were normal.  There is moderate activity within the left humeral head favored to be posttraumatic or degenerative.  As her disease was localized, we recommended radiation to the right breast, which she completed in May. She is up-to-date on pelvic examination, as well as colonoscopy.    INTERVAL HISTORY:  Rollande is here for routine follow up for recurrent low-grade lymphoma of multiple sites and left breast cancer. Patient states that she feels well. She informed me that she had a right shoulder tendon tear and was recommended arthroscopic surgery for repair due to ongoing pain after completing physical therapy. She has a WBC of 4.0, hemoglobin of 12.9, and platelet count of 175,000. Her CMP is normal other than an elevated creatinine of 1.09, improved from 1.29 and an elevated ALT of 46 up from 18. Her LDH is pending. I will see her back in 6 months with CBC, CMP, LDH, and bilateral mammogram.  Will She denies fever, chills, night sweats, or other signs of infection. She denies cardiorespiratory and gastrointestinal issues. She  denies pain. Her appetite is good and Her weight has increased 7 pounds over last 6 months.  REVIEW OF SYSTEMS:  Review of Systems  Constitutional: Negative.  Negative for appetite change, chills, diaphoresis, fatigue, fever and unexpected weight change.  HENT:  Negative.  Negative for hearing loss, lump/mass, mouth sores, nosebleeds, sore throat, tinnitus, trouble swallowing and voice change.   Eyes: Negative.  Negative for eye problems and icterus.  Respiratory: Negative.  Negative for chest tightness, cough, hemoptysis, shortness of breath and wheezing.   Cardiovascular: Negative.  Negative for chest pain, leg swelling and palpitations.  Gastrointestinal: Negative.  Negative for abdominal distention, abdominal pain, blood in stool, constipation, diarrhea, nausea, rectal pain and vomiting.  Endocrine: Negative.   Genitourinary: Negative.   Negative for bladder incontinence, difficulty urinating, dyspareunia, dysuria, frequency, hematuria, menstrual problem, nocturia, pelvic pain, vaginal bleeding and vaginal discharge.   Musculoskeletal:  Positive for arthralgias (right shoulder tendon tear). Negative for back pain, flank pain, gait problem, myalgias, neck pain and neck stiffness.  Skin: Negative.  Negative for itching, rash and wound.  Neurological: Negative.  Negative for dizziness, extremity weakness, gait problem, headaches, light-headedness, numbness, seizures and speech difficulty.  Hematological: Negative.  Negative for adenopathy. Does not bruise/bleed easily.  Psychiatric/Behavioral: Negative.  Negative for confusion, decreased concentration, depression, sleep disturbance and suicidal ideas. The patient is not nervous/anxious.     VITALS:  Blood pressure 126/80, pulse 70, temperature 98.1 F (36.7 C), temperature source Oral, resp. rate 16, height 5' 3.5 (1.613 m), weight 208 lb 14.4 oz (94.8 kg), SpO2 95%.  Wt Readings from Last 3 Encounters:  12/27/23 208 lb 14.4 oz (94.8 kg)  06/27/23 201 lb 1.6 oz (91.2 kg)  12/21/22 203 lb 14.4 oz (92.5 kg)    Body mass index is 36.42 kg/m.  Performance status (ECOG): 0 - Asymptomatic  PHYSICAL EXAM:  Physical Exam Vitals and nursing note reviewed.  Constitutional:      General: She is not in acute distress.    Appearance: Normal appearance. She is normal weight. She is not ill-appearing, toxic-appearing or diaphoretic.  HENT:     Head: Normocephalic and atraumatic.     Right Ear: Tympanic membrane, ear canal and external ear normal. There is no impacted cerumen.     Left Ear: Tympanic membrane, ear canal and external ear normal. There is no impacted cerumen.     Nose: Nose normal. No congestion or rhinorrhea.     Mouth/Throat:     Mouth: Mucous membranes are moist.     Pharynx: Oropharynx is clear. No oropharyngeal exudate or posterior oropharyngeal erythema.  Eyes:      General: No scleral icterus.       Right eye: No discharge.        Left eye: No discharge.     Extraocular Movements: Extraocular movements intact.     Conjunctiva/sclera: Conjunctivae normal.     Pupils: Pupils are equal, round, and reactive to light.  Neck:     Vascular: No carotid bruit.  Cardiovascular:     Rate and Rhythm: Normal rate and regular rhythm.     Pulses: Normal pulses.     Heart sounds: Normal heart sounds. No murmur heard.    No friction rub. No gallop.  Pulmonary:     Effort: Pulmonary effort is normal. No respiratory distress.     Breath sounds: Normal breath sounds. No stridor. No wheezing, rhonchi or rales.  Chest:     Chest wall: No tenderness.     Comments: Barely visible scar in the lower outer quadrant of the right breast along the areolar complex Well-healed scar  in the upper inner quadrant of the left breast Well-healed scar in the left axilla No masses in either breast Abdominal:     General: Bowel sounds are normal. There is no distension.     Palpations: Abdomen is soft. There is no hepatomegaly, splenomegaly or mass.     Tenderness: There is no abdominal tenderness. There is no right CVA tenderness, left CVA tenderness, guarding or rebound.     Hernia: No hernia is present.  Musculoskeletal:        General: No swelling, tenderness, deformity or signs of injury. Normal range of motion.     Cervical back: Normal range of motion and neck supple. No rigidity or tenderness.     Right lower leg: No edema.     Left lower leg: No edema.  Lymphadenopathy:     Cervical: No cervical adenopathy.  Skin:    General: Skin is warm and dry.     Coloration: Skin is not jaundiced or pale.     Findings: No bruising, erythema, lesion or rash.     Comments: Well-healed scar in the midline of back Scar lower left back No lesions are seen  Neurological:     General: No focal deficit present.     Mental Status: She is alert and oriented to person, place, and time.  Mental status is at baseline.     Cranial Nerves: No cranial nerve deficit.     Sensory: No sensory deficit.     Motor: No weakness.     Coordination: Coordination normal.     Gait: Gait normal.     Deep Tendon Reflexes: Reflexes normal.  Psychiatric:        Mood and Affect: Mood normal.        Behavior: Behavior normal.        Thought Content: Thought content normal.        Judgment: Judgment normal.    LABS:      Latest Ref Rng & Units 12/27/2023    1:00 PM 06/27/2023    2:01 PM 12/21/2022   10:31 AM  CBC  WBC 4.0 - 10.5 K/uL 4.0  4.0  4.0   Hemoglobin 12.0 - 15.0 g/dL 87.0  87.6  87.1   Hematocrit 36.0 - 46.0 % 38.4  35.3  38.1   Platelets 150 - 400 K/uL 175  192  204       Latest Ref Rng & Units 12/27/2023    1:00 PM 06/27/2023    2:01 PM 12/21/2022   10:31 AM  CMP  Glucose 70 - 99 mg/dL 871  87  899   BUN 6 - 20 mg/dL 12  9  14    Creatinine 0.44 - 1.00 mg/dL 8.90  8.70  8.85   Sodium 135 - 145 mmol/L 142  141  140   Potassium 3.5 - 5.1 mmol/L 3.8  3.8  3.7   Chloride 98 - 111 mmol/L 106  107  110   CO2 22 - 32 mmol/L 24  24  21    Calcium 8.9 - 10.3 mg/dL 9.2  9.5  9.0   Total Protein 6.5 - 8.1 g/dL 7.1  6.9  7.2   Total Bilirubin 0.0 - 1.2 mg/dL 0.7  0.6  0.7   Alkaline Phos 38 - 126 U/L 95  92  77   AST 15 - 41 U/L 39  27  34   ALT 0 - 44 U/L 46  18  39    Component Ref  Range & Units 6 mo ago (06/21/22) 1 yr ago (06/09/21) 1 yr ago (01/27/21) 3 yr ago (12/03/19) 3 yr ago (06/09/19)  Vitamin B-12 180 - 914 pg/mL 367 755 CM 309 CM 495 R 250 R     Lab Results  Component Value Date   LDH 209 (H) 12/27/2023   LDH 195 (H) 06/27/2023   LDH 167 12/21/2022    STUDIES:     HISTORY:   Allergies:  Allergies  Allergen Reactions   Hydrocodone-Acetaminophen Other (See Comments)    insomnia   Other Itching    Skin redness.  Adhesive agent (substance)   Rituximab Other (See Comments)    Other reaction(s): Not available  rituximab   Tape Itching     Skin redness.   Vicodin [Hydrocodone-Acetaminophen]     Insomnia     Current Medications: Current Outpatient Medications  Medication Sig Dispense Refill   amLODipine (NORVASC) 5 MG tablet Take 5 mg by mouth daily.     CALCIUM-MAGNESIUM-ZINC PO Take 1 tablet by mouth daily.     cholecalciferol (VITAMIN D ) 1000 units tablet Take 6,000 Units by mouth daily.     citalopram (CELEXA) 10 MG tablet Take 10 mg by mouth daily.     ezetimibe (ZETIA) 10 MG tablet Take 10 mg by mouth daily.     GLUTATHIONE PO Take by mouth. 2-3 times a day     L-Lysine 1000 MG TABS Take 1,000 mg by mouth daily.     losartan (COZAAR) 25 MG tablet Take 25 mg by mouth daily.     meloxicam (MOBIC) 7.5 MG tablet Take 7.5 mg by mouth daily as needed. for pain     Naproxen Sodium 220 MG CAPS Take by mouth.     NONFORMULARY OR COMPOUNDED ITEM Vitamin E once a day     pantoprazole  (PROTONIX ) 40 MG tablet Take 1 tablet (40 mg total) by mouth daily. Please keep appt for 01-03-24 for future refills 90 tablet 0   rosuvastatin (CRESTOR) 5 MG tablet Take 5 mg by mouth daily.      valACYclovir (VALTREX) 500 MG tablet Take 500 mg by mouth daily.     No current facility-administered medications for this visit.   ASSESSMENT & PLAN:  Assessment: 1. History of low-grade cutaneous lymphoma diagnosed in 2004 and treated with surgery and chemotherapy many years ago.   2. History of IA hormone receptor positive left breast cancer in August 2015 treated with surgery and radiation.  She did not tolerate hormonal therapy.  She remains without evidence of recurrence.  She had a screening bilateral mammogram done on 06/26/2023 that was clear.   3. 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     4. 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 This was treated with lumpectomy.  PET was otherwise negative, so no further therapy was recommended.   5. Subareolar right breast seroma,  confirmed by mammogram and ultrasound.    6.  Recurrent lymphoma of the lower outer retroareolar right breast in March 2022.  There was 1 thickened right axillary lymph node, but this was not hypermetabolic on PET imaging and biopsy was negative.  There was no evidence of lymphoma elsewhere on PET. She was treated with radiation therapy with an excellent response.  Her current mammogram is clear.  7. Mild Chronic Kidney Disease. I have repeated these labs today and her renal function is now nearly normal with a creatinine  of 1.09.    8. Skin lesion of the left anterior upper chest wall.  Pathology from September 2024 revealed this to be an intradermal nevus.  9. Osteopenia. Bone density scan done on 08/11/2022 showed osteopenia of the right hip and normal bone density in her spine and dual femur. She takes a oral supplement that has calcium, magnesium, and zinc.  Plan:     She informed me that she had a right shoulder tendon tear and was recommended arthroscopic surgery for repair due to ongoing pain after completing physical therapy. She has a WBC of 4.0, hemoglobin of 12.9, and platelet count of 175,000. Her CMP is normal other than an elevated creatinine of 1.09 improved from 1.29 and an elevated ALT of 46 up from 18. Her LDH is pending. I will see her back in 6 months with CBC, CMP, LDH, and bilateral mammogram. The patient understands the plans discussed today and is in agreement with them.  She knows to contact our office if she develops concerns prior to her next appointment.  I provided 21 minutes of face-to-face time during this this encounter and > 50% was spent counseling as documented under my assessment and plan.   Wanda VEAR Cornish, MD  [facility] - A DEPT OF [facility] [address] [place] [address] Dept: [phone] Dept Fax: [phone]   No orders of the defined types were placed in this  encounter.   I,Lavontae Cornia H Trayce Maino,acting as a scribe for Wanda VEAR Cornish, MD.,have documented all relevant documentation on the behalf of Wanda VEAR Cornish, MD,as directed by  Wanda VEAR Cornish, MD while in the presence of Wanda VEAR Cornish, MD.  I have reviewed this report as typed by the medical scribe, and it is complete and accurate.

## 2023-12-27 NOTE — Telephone Encounter (Signed)
 Patient has been scheduled for follow-up visit per 12/27/23 LOS.  Pt given an appt calendar with date and time.

## 2024-01-03 ENCOUNTER — Ambulatory Visit: Admitting: Gastroenterology

## 2024-01-08 ENCOUNTER — Encounter: Payer: Self-pay | Admitting: Oncology

## 2024-02-08 ENCOUNTER — Ambulatory Visit: Admitting: Gastroenterology

## 2024-03-07 ENCOUNTER — Other Ambulatory Visit: Payer: Self-pay | Admitting: Gastroenterology

## 2024-03-13 ENCOUNTER — Ambulatory Visit: Admitting: Gastroenterology

## 2024-03-13 ENCOUNTER — Encounter: Payer: Self-pay | Admitting: Gastroenterology

## 2024-03-13 VITALS — BP 126/80 | HR 60 | Ht 63.5 in | Wt 210.0 lb

## 2024-03-13 DIAGNOSIS — Z800 Family history of malignant neoplasm of digestive organs: Secondary | ICD-10-CM

## 2024-03-13 DIAGNOSIS — K581 Irritable bowel syndrome with constipation: Secondary | ICD-10-CM | POA: Diagnosis not present

## 2024-03-13 DIAGNOSIS — K219 Gastro-esophageal reflux disease without esophagitis: Secondary | ICD-10-CM | POA: Diagnosis not present

## 2024-03-13 DIAGNOSIS — C8590 Non-Hodgkin lymphoma, unspecified, unspecified site: Secondary | ICD-10-CM | POA: Diagnosis not present

## 2024-03-13 DIAGNOSIS — K449 Diaphragmatic hernia without obstruction or gangrene: Secondary | ICD-10-CM | POA: Diagnosis not present

## 2024-03-13 DIAGNOSIS — Z170 Estrogen receptor positive status [ER+]: Secondary | ICD-10-CM

## 2024-03-13 DIAGNOSIS — D519 Vitamin B12 deficiency anemia, unspecified: Secondary | ICD-10-CM

## 2024-03-13 DIAGNOSIS — C50912 Malignant neoplasm of unspecified site of left female breast: Secondary | ICD-10-CM | POA: Diagnosis not present

## 2024-03-13 MED ORDER — PANTOPRAZOLE SODIUM 40 MG PO TBEC: 40.0000 mg | DELAYED_RELEASE_TABLET | Freq: Every day | ORAL | 0 refills | Status: AC

## 2024-03-13 NOTE — Progress Notes (Signed)
 "  Chief Complaint:follow-up GERD Primary GI Doctor: Dr. 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    Interval History Patient last seen in GI office on 11/06/22 by Dr. Charlanne for follow-up.  Patient has history of GERD and is on Pantoprazole  40 mg po daily. She takes a second dose occasionally for breakthrough symptoms.  She reports Jan 2025 she was on a GLP 1 and ended up in ER due to stabbing pain in her back. She was worked up and ruled out for any cardiac issues. They did EKG and stress test. They suspected Lynore Coscia be due to GERD. She did not follow-up with our office due to other health issues that occurred. She reports she has not had severe episode similar to ED visit, but will have occasional pyrosis and regurgitation. She has occasional eso dysphagia. She reports having episodes were something is stuck in her chest and she cannot clear it. She also notes some hoarseness intermittently.   She has history of chronic constipation. She has BM every 1-3 days. No blood in stool.   She also shares with me she fell in February 2025 and broke her wrist which was a 5 week recovery. Also had torn tendon in shoulder which required additional rehab after that.   Not on blood thinners.   Nonsmoker. No alcohol use.   Surgical history:  lumpectomy, right   Patient's family history includes: uncle with colon CA with colostomy   Wt Readings from Last 3 Encounters:  03/13/24 210 lb (95.3 kg)  12/27/23 208 lb 14.4 oz (94.8 kg)  06/27/23 201 lb 1.6 oz (91.2 kg)    Past Medical History:  Diagnosis Date   Breast cancer (HCC) 10/16/2013   Cancer (HCC) 2004/2006/2020   Low B Cell lymphoma   Chronic constipation    Family history of colon cancer    Family history of prostate cancer    GERD (gastroesophageal reflux disease)     HTN (hypertension)    Hypercholesteremia    Personal history of radiation therapy    Plantar fasciitis    Vitamin B 12 deficiency     Past Surgical History:  Procedure Laterality Date   APPENDECTOMY     BREAST LUMPECTOMY Left 2015   BREAST LUMPECTOMY Right 08/26/2018   CESAREAN SECTION     CHOLECYSTECTOMY     COLONOSCOPY  08/08/2013   Colon polyp-status post polypectomy. Small internal hemorrhoids. Otherwise normal colonoscopy.    ESOPHAGOGASTRODUODENOSCOPY  06/24/2012   Mild gastritis. Healed esophageal erosions. Otherwise normal EGD.    OOPHORECTOMY     SKIN BIOPSY     multiple   Throat Polyp removed     TONSILLECTOMY      Current Outpatient Medications  Medication Sig Dispense Refill   amLODipine (NORVASC) 5 MG tablet Take 5 mg by mouth daily.     CALCIUM-MAGNESIUM-ZINC PO Take 1 tablet by mouth daily.     cholecalciferol (VITAMIN D ) 1000 units tablet Take 6,000 Units by mouth daily.     citalopram (CELEXA) 10 MG tablet Take 10 mg by mouth daily.     ezetimibe (ZETIA) 10 MG tablet Take 10 mg by mouth daily.     GLUTATHIONE PO Take by mouth. 2-3 times a day     L-Lysine 1000 MG TABS Take 1,000 mg by  mouth daily.     losartan (COZAAR) 25 MG tablet Take 25 mg by mouth daily.     meloxicam (MOBIC) 7.5 MG tablet Take 7.5 mg by mouth daily as needed. for pain     NONFORMULARY OR COMPOUNDED ITEM Vitamin E once a day     rosuvastatin (CRESTOR) 5 MG tablet Take 5 mg by mouth daily.      Naproxen Sodium 220 MG CAPS Take by mouth. (Patient not taking: Reported on 03/13/2024)     pantoprazole  (PROTONIX ) 40 MG tablet Take 1 tablet (40 mg total) by mouth daily. 30 tablet 0   valACYclovir (VALTREX) 500 MG tablet Take 500 mg by mouth daily. (Patient not taking: Reported on 03/13/2024)     No current facility-administered medications for this visit.    Allergies as of 03/13/2024 - Review Complete 03/13/2024  Allergen Reaction Noted   Hydrocodone-acetaminophen Other (See Comments)  04/13/2016   Other Itching 08/21/2018   Rituximab Other (See Comments) 04/13/2016   Tape Itching 08/21/2018   Vicodin [hydrocodone-acetaminophen]  04/13/2016    Family History  Problem Relation Age of Onset   Colon polyps Mother    Lung cancer Father 37   Cancer Maternal Aunt 60       GIST   Stomach cancer Maternal Aunt    Colon cancer Maternal Uncle 80       dx twice at age 82   Lung cancer Maternal Grandmother 68   Prostate cancer Maternal Grandfather 64   Esophageal cancer Neg Hx    Rectal cancer Neg Hx     Review of Systems:    Constitutional: No weight loss, fever, chills, weakness or fatigue HEENT: Eyes: No change in vision               Ears, Nose, Throat:  No change in hearing or congestion Skin: No rash or itching Cardiovascular: No chest pain, chest pressure or palpitations   Respiratory: No SOB or cough Gastrointestinal: See HPI and otherwise negative Genitourinary: No dysuria or change in urinary frequency Neurological: No headache, dizziness or syncope Musculoskeletal: No new muscle or joint pain Hematologic: No bleeding or bruising Psychiatric: No history of depression or anxiety    Physical Exam:  Vital signs: BP 126/80   Pulse 60   Ht 5' 3.5 (1.613 m)   Wt 210 lb (95.3 kg)   BMI 36.62 kg/m   Constitutional:  Pleasant  female appears to be in NAD, Well developed, Well nourished, alert and cooperative Eyes:   PEERL, EOMI. No icterus. Conjunctiva pink. Neck:  Supple Throat: Oral cavity and pharynx without inflammation, swelling or lesion.  Respiratory: Respirations even and unlabored. Lungs clear to auscultation bilaterally.   No wheezes, crackles, or rhonchi.  Cardiovascular: Normal S1, S2. Regular rate and rhythm. No peripheral edema, cyanosis or pallor.  Gastrointestinal:  Soft, nondistended, nontender. No rebound or guarding. Normal bowel sounds. No appreciable masses or hepatomegaly. Rectal:  Not performed.  Msk:  Symmetrical without gross  deformities. Without edema, no deformity or joint abnormality.  Neurologic:  Alert and  oriented x4;  grossly normal neurologically.  Skin:   Dry and intact without significant lesions or rashes.  RELEVANT LABS AND IMAGING: CBC    Latest Ref Rng & Units 12/27/2023    1:00 PM 06/27/2023    2:01 PM 12/21/2022   10:31 AM  CBC  WBC 4.0 - 10.5 K/uL 4.0  4.0  4.0   Hemoglobin 12.0 - 15.0 g/dL 87.0  87.6  12.8  Hematocrit 36.0 - 46.0 % 38.4  35.3  38.1   Platelets 150 - 400 K/uL 175  192  204      CMP     Latest Ref Rng & Units 12/27/2023    1:00 PM 06/27/2023    2:01 PM 12/21/2022   10:31 AM  CMP  Glucose 70 - 99 mg/dL 871  87  899   BUN 6 - 20 mg/dL 12  9  14    Creatinine 0.44 - 1.00 mg/dL 8.90  8.70  8.85   Sodium 135 - 145 mmol/L 142  141  140   Potassium 3.5 - 5.1 mmol/L 3.8  3.8  3.7   Chloride 98 - 111 mmol/L 106  107  110   CO2 22 - 32 mmol/L 24  24  21    Calcium 8.9 - 10.3 mg/dL 9.2  9.5  9.0   Total Protein 6.5 - 8.1 g/dL 7.1  6.9  7.2   Total Bilirubin 0.0 - 1.2 mg/dL 0.7  0.6  0.7   Alkaline Phos 38 - 126 U/L 95  92  77   AST 15 - 41 U/L 39  27  34   ALT 0 - 44 U/L 46  18  39      Assessment: Encounter Diagnoses  Name Primary?   Hiatal hernia with GERD Yes   Family history of colon cancer    Anemia due to vitamin B12 deficiency, unspecified B12 deficiency type    Lymphoma, low grade (HCC)    Malignant neoplasm of left breast in female, estrogen receptor positive, unspecified site of breast (HCC)    Irritable bowel syndrome with constipation   #1. GERD with small HH, increased symptoms as of last year.   #2. IDA (resolved).Hgb 12.9. H/O B12 deficiency. B 12 398. Neg EGD with SB Bx/colon 12/2018.    #3 H/O low-grade cutaneous lymphoma 2004 (s/p resection followed by chemo), H/O left breast AdenoCA 2015 s/p lumpectomy and radiation. H/O lymphoma right ovary 2016 (treated with surgery alone). H/O right breast follicular lymphoma s/p lumpectomy/XRT.    #4. 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 Neg CT AP 04/29/2020  Plan: - start OTC Miralax po daily  -Recommend high fiber diet  -Scheduled EGD in [facility] with Dr. Charlanne. The risks and benefits of EGD with possible biopsies and esophageal dilation were discussed with the patient who agrees to proceed. -Rpt screening colon Nov 2030. Pt would like to enquire about doing both due to hx of lymphoma, will defer to Dr. Charlanne.   -request records from [place] [facility].   Thank you for the courtesy of this consult. Please call me with any questions or concerns.   Gurshaan Matsuoka, FNP-C [facility] 03/13/2024, 2:00 PM  Cc: Trinidad Hun, MD  "

## 2024-03-13 NOTE — Patient Instructions (Addendum)
 GERD Recommend GERD diet, no late meals  Continue Pantoprazole  40mg  po daily , refilled   Constipation Start OTC Miralax po daily   Recommend high fiber diet   _______________________________________________________  If your blood pressure at your visit was 140/90 or greater, please contact your primary care physician to follow up on this.  _______________________________________________________  If you are age 60 or older, your body mass index should be between 23-30. Your Body mass index is 36.62 kg/m. If this is out of the aforementioned range listed, please consider follow up with your Primary Care Provider.  If you are age 108 or younger, your body mass index should be between 19-25. Your Body mass index is 36.62 kg/m. If this is out of the aformentioned range listed, please consider follow up with your Primary Care Provider.   ________________________________________________________  The [facility] providers would like to encourage you to use MYCHART to communicate with providers for non-urgent requests or questions.  Due to long hold times on the telephone, sending your provider a message by [facility] may be a faster and more efficient way to get a response.  Please allow 48 business hours for a response.  Please remember that this is for non-urgent requests.  _______________________________________________________  [facility] is using a team-based approach to care.  Your team is made up of your doctor and two to three APPS. Our APPS (Nurse Practitioners and Physician Assistants) work with your physician to ensure care continuity for you. They are fully qualified to address your health concerns and develop a treatment plan. They communicate directly with your gastroenterologist to care for you. Seeing the Advanced Practice Practitioners on your physician's team can help you by facilitating care more promptly, often allowing for earlier appointments, access to diagnostic  testing, procedures, and other specialty referrals.   Thank you for trusting me with your gastrointestinal care. Deanna May, FNP-C

## 2024-04-01 ENCOUNTER — Other Ambulatory Visit: Payer: Self-pay

## 2024-04-01 ENCOUNTER — Telehealth: Payer: Self-pay

## 2024-04-01 DIAGNOSIS — K449 Diaphragmatic hernia without obstruction or gangrene: Secondary | ICD-10-CM

## 2024-04-01 DIAGNOSIS — Z800 Family history of malignant neoplasm of digestive organs: Secondary | ICD-10-CM

## 2024-04-01 MED ORDER — NA SULFATE-K SULFATE-MG SULF 17.5-3.13-1.6 GM/177ML PO SOLN: 1.0000 | Freq: Once | ORAL | 0 refills | Status: AC

## 2024-04-01 NOTE — Telephone Encounter (Signed)
 Pt was made aware of Deanna May NP recommendations.  Pt was scheduled for an EGD/ Colonoscopy on 05/16/2024 at 1:00 PM with Dr. Charlanne.  Pt made aware.  Ambulatory referral to Gastroenterology placed in Epic. Prep sent to pharmacy. Pt made aware.  Prep letter sent to pt via my chart. Pt made aware.  Pt verbalized understanding with all questions answered.

## 2024-05-16 ENCOUNTER — Encounter: Admitting: Gastroenterology

## 2024-06-26 ENCOUNTER — Inpatient Hospital Stay

## 2024-07-03 ENCOUNTER — Inpatient Hospital Stay: Admitting: Oncology
